# Patient Record
Sex: Female | Born: 1990 | Race: Asian | Hispanic: No | Marital: Single | Smoking: Never smoker
Health system: Southern US, Community
[De-identification: ages and names within clinical notes are randomized; demographics above are authoritative.]

## PROBLEM LIST (undated history)

## (undated) ENCOUNTER — Inpatient Hospital Stay (HOSPITAL_COMMUNITY): Payer: Self-pay

## (undated) DIAGNOSIS — O24419 Gestational diabetes mellitus in pregnancy, unspecified control: Secondary | ICD-10-CM

## (undated) HISTORY — PX: NO PAST SURGERIES: SHX2092

---
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
## 2010-04-16 ENCOUNTER — Encounter (INDEPENDENT_AMBULATORY_CARE_PROVIDER_SITE_OTHER): Payer: Medicaid Other | Admitting: Internal Medicine

## 2010-04-16 DIAGNOSIS — Z0000 Encounter for general adult medical examination without abnormal findings: Secondary | ICD-10-CM

## 2010-05-04 LAB — CBC
HCT: 30.7 % — ABNORMAL LOW (ref 36.0–46.0)
Hemoglobin: 10.9 g/dL — ABNORMAL LOW (ref 12.0–15.0)
RDW: 14.3 % (ref 11.5–15.5)

## 2010-05-04 LAB — RPR: RPR Ser Ql: NONREACTIVE

## 2010-06-09 NOTE — Discharge Summary (Signed)
Tammy Roberson, Tammy Roberson                ACCOUNT NO.:  [id number]   MEDICAL RECORD NO.:  [id number]          PATIENT TYPE:  INP   LOCATION:  9113                          FACILITY:  [facility]   PHYSICIAN:  Malachi Pro. Ambrose Mantle, M.D. DATE OF BIRTH:  03/03/1990   DATE OF ADMISSION:  06/25/2008  DATE OF DISCHARGE:  06/27/2008                               DISCHARGE SUMMARY   This is an 20 year old Asian single female para 0, gravida 1, EDC July 20, 2008, admitted in the second stage of labor.  Blood group and type  B+, negative antibody, nonreactive serology, rubella immune, hepatitis B  surface antigen negative, HIV negative, GC and chlamydia negative, first  trimester screen negative, AFP negative, 1-hour Glucola 92.  Vaginal  ultrasound on January 12, 2008, crown-rump length 4.65 cm, 11 weeks 3  days, [facility] July 20, 2008.  Prenatal care was uncomplicated.  The patient  came to our office on the day of admission with contractions and the  cervix was 8 cm dilated.  She was sent to Maternity Admission Unit, was  fully dilated, had spontaneous rupture of membranes of clear fluid in  the labor and delivery bed, and was unable to control her pushing.   PAST MEDICAL HISTORY:  No known allergies.  No operations.  No  illnesses.   FAMILY HISTORY:  Negative.   SOCIAL HISTORY:  Alcohol, tobacco and drugs none.   On admission, her vital signs were normal.  The heart and lungs were  normal.  The abdomen was soft, near term-size fundus.  Fetal heart tones  were normal.  The cervix was 10 cm.  Membranes were intact with the exam  that I did, the vertex at a +1 station.  After my exam, spontaneous  rupture of membranes produced clear fluid, vertex descended, and the  baby delivered.   IMPRESSION:  Intrauterine pregnancy at 36 weeks and 3 days, delivered  vertex LOA.  Operation, spontaneous delivery vertex, right labial and  vaginal lacerations.  Delivery was of a living 5-pound 2-ounce female  infant with  Apgars of 9 at 1 and 9 at 5 minutes.  Placenta was intact.  The uterus was normal.  Laceration repaired with 3-0 Vicryl under local  block.  Blood loss about 400 mL.  I instructed the patient and her  relatives at the time of delivery that in order to have circumcision  done.  They would need to pay for it, both to the office and to the  hospital.  They understood this.  On the morning of the first postpartum  day at 6:45 a.m., I called the nursery to see if the circumcision had  been paid.  It had not been paid.  On the second postpartum day at 6:45  a.m., I called the nursery to see if the circumcision had been paid.  It  had not been paid.  At the time of discharge, the patient stated that  she wanted the circumcision done, but there was no one there to pay for  the circumcision.  I told her that she should pay it and  we could  schedule it as outpatient that the hospital provides outpatient services  at least once a week.   LABORATORY DATA:  The patient did not have an antepartum hemoglobin.  She arrived ready for delivery, but her hemoglobin first postpartum day  was 10.9, hematocrit 30.7, white count 8400, platelet count 259,000.  RPR was nonreactive.   FINAL DIAGNOSES:  Intrauterine pregnancy at 36 weeks and 2 days,  delivered left occipitoanterior.   OPERATION:  Spontaneous delivery left occipitoanterior, repair of labial  and vaginal laceration.   FINAL CONDITION:  Improved.   INSTRUCTIONS:  Our regular discharge instruction booklet.  The patient  is advised to return to the office in 6 weeks for followup examination.  I will do the circumcision as an outpatient whenever they get that see  paid.   DISCHARGE MEDICATIONS:  Continue prenatal vitamins, take iron sulfate  325 mg twice a day, and Motrin 600 mg 30 tablets 1 every 6 hours as  needed for pain.      Malachi Pro. Ambrose Mantle, M.D.  Electronically Signed     TFH/MEDQ  D:  06/27/2008  T:  06/27/2008  Job:  [id number]

## 2012-01-26 NOTE — L&D Delivery Note (Signed)
Delivery Note At 9:56 AM a viable and healthy female was delivered via Vaginal, Spontaneous Delivery (Presentation: OA;ROT  ).  APGAR: 9,9 ; weight P.   Placenta status: delivered, intact.  Cord: 3VC with the following complications: nuchal .    Anesthesia: Epidural  Episiotomy: none Lacerations: labial abrasion - hemostatic Suture Repair: N/A Est. Blood Loss (mL): 400cc  Mom to postpartum.  Baby to Couplet care / Skin to Skin.  BOVARD,Babita Amaker 12/27/2012, 10:08 AM  Br/B+/Contra?Rachelle Hora

## 2012-05-30 LAB — OB RESULTS CONSOLE ANTIBODY SCREEN: Antibody Screen: NEGATIVE

## 2012-05-30 LAB — OB RESULTS CONSOLE ABO/RH

## 2012-05-30 LAB — OB RESULTS CONSOLE RUBELLA ANTIBODY, IGM: Rubella: IMMUNE

## 2012-05-30 LAB — OB RESULTS CONSOLE GC/CHLAMYDIA: Gonorrhea: NEGATIVE

## 2012-08-03 ENCOUNTER — Inpatient Hospital Stay (HOSPITAL_COMMUNITY)
Admission: AD | Admit: 2012-08-03 | Discharge: 2012-08-03 | Disposition: A | Discharge status: Home / Self Care | Payer: Medicaid Other | Source: Ambulatory Visit | Attending: Obstetrics and Gynecology | Admitting: Obstetrics and Gynecology

## 2012-08-03 ENCOUNTER — Encounter (HOSPITAL_COMMUNITY): Payer: Self-pay

## 2012-08-03 DIAGNOSIS — O99612 Diseases of the digestive system complicating pregnancy, second trimester: Secondary | ICD-10-CM

## 2012-08-03 DIAGNOSIS — O99891 Other specified diseases and conditions complicating pregnancy: Secondary | ICD-10-CM

## 2012-08-03 DIAGNOSIS — N949 Unspecified condition associated with female genital organs and menstrual cycle: Secondary | ICD-10-CM | POA: Insufficient documentation

## 2012-08-03 DIAGNOSIS — K5900 Constipation, unspecified: Secondary | ICD-10-CM

## 2012-08-03 DIAGNOSIS — R109 Unspecified abdominal pain: Secondary | ICD-10-CM | POA: Insufficient documentation

## 2012-08-03 LAB — CBC
Hemoglobin: 11.1 g/dL — ABNORMAL LOW (ref 12.0–15.0)
MCH: 30.2 pg (ref 26.0–34.0)
MCV: 85.6 fL (ref 78.0–100.0)
RBC: 3.67 MIL/uL — ABNORMAL LOW (ref 3.87–5.11)

## 2012-08-03 LAB — URINALYSIS, ROUTINE W REFLEX MICROSCOPIC
Bilirubin Urine: NEGATIVE
Hgb urine dipstick: NEGATIVE
Specific Gravity, Urine: 1.015 (ref 1.005–1.030)
Urobilinogen, UA: 0.2 mg/dL (ref 0.0–1.0)
pH: 7 (ref 5.0–8.0)

## 2012-08-03 LAB — COMPREHENSIVE METABOLIC PANEL
BUN: 5 mg/dL — ABNORMAL LOW (ref 6–23)
CO2: 24 mEq/L (ref 19–32)
Calcium: 8.9 mg/dL (ref 8.4–10.5)
Creatinine, Ser: 0.48 mg/dL — ABNORMAL LOW (ref 0.50–1.10)
GFR calc Af Amer: >90 mL/min (ref >90)
GFR calc non Af Amer: >90 mL/min (ref >90)
Glucose, Bld: 93 mg/dL (ref 70–99)

## 2012-08-03 LAB — AMYLASE: Amylase: 76 U/L (ref 0–105)

## 2012-08-03 MED ORDER — IBUPROFEN 600 MG PO TABS: 600.0000 mg | ORAL_TABLET | Freq: Four times a day (QID) | ORAL | Status: DC | PRN

## 2012-08-03 MED ORDER — OXYCODONE-ACETAMINOPHEN 5-325 MG PO TABS
1.0000 | ORAL_TABLET | Freq: Once | ORAL | Status: AC
  Administered 2012-08-03: 1 via ORAL
  Filled 2012-08-03: qty 1

## 2012-08-03 NOTE — MAU Note (Signed)
Intermittent abdominal pain since yesterday. Described as squeezing pain that starts at the of her abdomen and spreads to the bottom of her abdomen. Denies vaginal bleeding or leaking of fluid. Has had some vaginal discharge that is white and normal for her. Has episode of chills this morning but has not taken temperature.

## 2012-08-03 NOTE — MAU Provider Note (Signed)
Chief Complaint: Abdominal Pain and Chills   First Provider Initiated Contact with Patient 08/03/12 0544     SUBJECTIVE HPI: Tammy Roberson is a 22 y.o. G2P0101 at [redacted]w[redacted]d by [place] who presents with intermittent abd tightening from epigastric area wrapping around bital sides of abd to low abd. has had problems with constipation throughout the pregnancy. Last bowel movement yesterday. Took 2 doses of MiraLAX and past 24 hours, but has not had a bowel movement yet. Denies fever, chills, nausea, vomiting, diarrhea,  urinary complaints, vaginal bleeding, or leaking of fluid.  Normal vaginal discharge.  Past Medical History  Diagnosis Date  . Medical history non-contributory    OB History   Grav Para Term Preterm Abortions TAB SAB Ect Mult Living   2 1  1      1      # Outc Date GA Lbr Len/2nd Wgt Sex Del Anes PTL Lv   1 PRE 6/10 [redacted]w[redacted]d   M SVD   Yes   2 CUR              Past Surgical History  Procedure Laterality Date  . No past surgeries     History   Social History  . Marital Status: Single    Spouse Name: N/A    Number of Children: N/A  . Years of Education: N/A   Occupational History  . Not on file.   Social History Main Topics  . Smoking status: Never Smoker   . Smokeless tobacco: Not on file  . Alcohol Use: No  . Drug Use: No  . Sexually Active: Yes   Other Topics Concern  . Not on file   Social History Narrative  . No narrative on file   No current facility-administered medications on file prior to encounter.   No current outpatient prescriptions on file prior to encounter.   No Known Allergies  ROS: Pertinent items in HPI  OBJECTIVE Blood pressure 106/58, pulse 80, temperature 98 F (36.7 C), temperature source Oral, resp. rate 16, height 5' (1.524 m), weight 49.533 kg (109 lb 3.2 oz), last menstrual period 03/19/2012. GENERAL: Well-developed, well-nourished female in no acute distress.  HEENT: Normocephalic HEART: normal rate RESP: normal effort ABDOMEN:  Soft, mild bilateral groin tenderness. No epigastric tenderness. Gravid, size equals dates EXTREMITIES: Nontender, no edema NEURO: Alert and oriented SPECULUM EXAM: NEFG, physiologic discharge, no blood noted, cervix clean BIMANUAL: cervix closed and long; uterus size = dates, no adnexal tenderness or masses FHTs 152 by doppler.   LAB RESULTS Results for orders placed during the hospital encounter of 08/03/12 (from the past 24 hour(s))  URINALYSIS, ROUTINE W REFLEX MICROSCOPIC     Status: Abnormal   Collection Time    08/03/12  5:02 AM      Result Value Range   Color, Urine YELLOW  YELLOW   APPearance CLEAR  CLEAR   Specific Gravity, Urine 1.015  1.005 - 1.030   pH 7.0  5.0 - 8.0   Glucose, UA NEGATIVE  NEGATIVE mg/dL   Hgb urine dipstick NEGATIVE  NEGATIVE   Bilirubin Urine NEGATIVE  NEGATIVE   Ketones, ur 40 (*) NEGATIVE mg/dL   Protein, ur NEGATIVE  NEGATIVE mg/dL   Urobilinogen, UA 0.2  0.0 - 1.0 mg/dL   Nitrite NEGATIVE  NEGATIVE   Leukocytes, UA NEGATIVE  NEGATIVE  CBC     Status: Abnormal   Collection Time    08/03/12  5:40 AM      Result Value Range  WBC 7.0  4.0 - 10.5 K/uL   RBC 3.67 (*) 3.87 - 5.11 MIL/uL   Hemoglobin 11.1 (*) 12.0 - 15.0 g/dL   HCT 16.1 (*) 09.6 - 04.5 %   MCV 85.6  78.0 - 100.0 fL   MCH 30.2  26.0 - 34.0 pg   MCHC 35.4  30.0 - 36.0 g/dL   RDW 40.9  81.1 - 91.4 %   Platelets 204  150 - 400 K/uL  COMPREHENSIVE METABOLIC PANEL     Status: Abnormal   Collection Time    08/03/12  5:40 AM      Result Value Range   Sodium 134 (*) 135 - 145 mEq/L   Potassium 3.4 (*) 3.5 - 5.1 mEq/L   Chloride 100  96 - 112 mEq/L   CO2 24  19 - 32 mEq/L   Glucose, Bld 93  70 - 99 mg/dL   BUN 5 (*) 6 - 23 mg/dL   Creatinine, Ser 7.82 (*) 0.50 - 1.10 mg/dL   Calcium 8.9  8.4 - 95.6 mg/dL   Total Protein 6.1  6.0 - 8.3 g/dL   Albumin 3.1 (*) 3.5 - 5.2 g/dL   AST 13  0 - 37 U/L   ALT 7  0 - 35 U/L   Alkaline Phosphatase 52  39 - 117 U/L   Total Bilirubin 0.3   0.3 - 1.2 mg/dL   GFR calc non Af Amer >90  >90 mL/min   GFR calc Af Amer >90  >90 mL/min  LIPASE, BLOOD     Status: None   Collection Time    08/03/12  5:40 AM      Result Value Range   Lipase 27  11 - 59 U/L  AMYLASE     Status: None   Collection Time    08/03/12  5:40 AM      Result Value Range   Amylase 76  0 - 105 U/L    IMAGING No results found.  MAU COURSE  ASSESSMENT 1. Constipation in pregnancy in second trimester    PLAN Discharge home in stable condition. Increase fluids and fiber. Expect pain will improve after bowel movement.     Follow-up Information   Follow up with [facility]. (As scheduled)    Contact information:   [address] [place] [address] [phone]      Follow up with [facility] [place] [facility]. (As needed if symptoms worsen)    Contact information:   [address] [id number] [place] [address] [phone]       Medication List         ibuprofen 600 MG tablet  Commonly known as:  ADVIL,MOTRIN  Take 1 tablet (600 mg total) by mouth every 6 (six) hours as needed for pain.     polyethylene glycol packet  Commonly known as:  MIRALAX / GLYCOLAX  Take 17 g by mouth daily.     prenatal multivitamin Tabs  Take 1 tablet by mouth daily at 12 noon.       [place], CNM 08/03/2012  8:52 AM

## 2012-11-29 ENCOUNTER — Encounter: Payer: BC Managed Care – PPO | Attending: Obstetrics and Gynecology

## 2012-11-29 VITALS — Ht 59.0 in | Wt 130.3 lb

## 2012-11-29 DIAGNOSIS — Z713 Dietary counseling and surveillance: Secondary | ICD-10-CM | POA: Insufficient documentation

## 2012-11-29 DIAGNOSIS — O99810 Abnormal glucose complicating pregnancy: Secondary | ICD-10-CM | POA: Insufficient documentation

## 2012-12-04 NOTE — Progress Notes (Addendum)
  Patient was seen on 12/01/12 for Gestational Diabetes self-management class at the [facility].   The following learning objectives were presented.  However that patient slep through most of the class. She did awaken to partificate in glucometer activity.   States the definition of Gestational Diabetes  States why dietary management is important in controlling blood glucose  Describes the effects of carbohydrates on blood glucose levels  Demonstrates ability to create a balanced meal plan  Demonstrates carbohydrate counting   States when to check blood glucose levels  States the effect of stress and exercise on blood glucose levels  States the importance of limiting caffeine and abstaining from alcohol and smoking  Plan:  Aim for 2 Carb Choices per meal (30 grams) +/- 1 either way for breakfast Aim for 3 Carb Choices per meal (45 grams) +/- 1 either way from lunch and dinner Aim for 1-2 Carbs per snack Begin reading food labels for Total Carbohydrate and sugar grams of foods Consider  increasing your activity level by walking daily as tolerated Begin checking BG before breakfast and 1-2 hours after first bit of breakfast, lunch and dinner after  as directed by MD  Take medication  as directed by MD  Blood glucose monitor given: Accu Chek Nano BG Monitoring Kit Lot #[id number] Exp 11/30/4  Patient instructed to monitor glucose levels: FBS: 60 - <90 1 hour: <140 2 hour: <120  Patient received the following handouts:  Nutrition Diabetes and Pregnancy  Carbohydrate Counting List  Meal Planning worksheet  Patient will be seen for follow-up as needed.

## 2012-12-27 ENCOUNTER — Encounter (HOSPITAL_COMMUNITY): Payer: BC Managed Care – PPO | Admitting: Anesthesiology

## 2012-12-27 ENCOUNTER — Inpatient Hospital Stay (HOSPITAL_COMMUNITY)
Admission: AD | Admit: 2012-12-27 | Discharge: 2012-12-29 | DRG: 775 | Disposition: A | Discharge status: Home / Self Care | Payer: BC Managed Care – PPO | Source: Ambulatory Visit | Attending: Obstetrics and Gynecology | Admitting: Obstetrics and Gynecology

## 2012-12-27 ENCOUNTER — Encounter (HOSPITAL_COMMUNITY): Payer: Self-pay

## 2012-12-27 ENCOUNTER — Inpatient Hospital Stay (HOSPITAL_COMMUNITY): Payer: BC Managed Care – PPO | Admitting: Anesthesiology

## 2012-12-27 DIAGNOSIS — O99814 Abnormal glucose complicating childbirth: Principal | ICD-10-CM | POA: Diagnosis present

## 2012-12-27 HISTORY — DX: Gestational diabetes mellitus in pregnancy, unspecified control: O24.419

## 2012-12-27 LAB — CBC
MCH: 30.2 pg (ref 26.0–34.0)
MCV: 86.1 fL (ref 78.0–100.0)
Platelets: 259 10*3/uL (ref 150–400)
RBC: 4.4 MIL/uL (ref 3.87–5.11)
RDW: 13 % (ref 11.5–15.5)

## 2012-12-27 LAB — TYPE AND SCREEN
ABO/RH(D): B POS
Antibody Screen: NEGATIVE

## 2012-12-27 LAB — ABO/RH: ABO/RH(D): B POS

## 2012-12-27 MED ORDER — ONDANSETRON HCL 4 MG/2ML IJ SOLN: 4.0000 mg | INTRAMUSCULAR | Status: DC | PRN

## 2012-12-27 MED ORDER — OXYTOCIN 40 UNITS IN LACTATED RINGERS INFUSION - SIMPLE MED
62.5000 mL/h | INTRAVENOUS | Status: DC
  Filled 2012-12-27: qty 1000

## 2012-12-27 MED ORDER — LANOLIN HYDROUS EX OINT: TOPICAL_OINTMENT | CUTANEOUS | Status: DC | PRN

## 2012-12-27 MED ORDER — DIBUCAINE 1 % RE OINT: 1.0000 "application " | TOPICAL_OINTMENT | RECTAL | Status: DC | PRN

## 2012-12-27 MED ORDER — ZOLPIDEM TARTRATE 5 MG PO TABS: 5.0000 mg | ORAL_TABLET | Freq: Every evening | ORAL | Status: DC | PRN

## 2012-12-27 MED ORDER — WITCH HAZEL-GLYCERIN EX PADS: 1.0000 "application " | MEDICATED_PAD | CUTANEOUS | Status: DC | PRN

## 2012-12-27 MED ORDER — SIMETHICONE 80 MG PO CHEW: 80.0000 mg | CHEWABLE_TABLET | ORAL | Status: DC | PRN

## 2012-12-27 MED ORDER — LIDOCAINE-EPINEPHRINE (PF) 2 %-1:200000 IJ SOLN
INTRAMUSCULAR | Status: DC | PRN
  Administered 2012-12-27: 5 mL via EPIDURAL

## 2012-12-27 MED ORDER — ONDANSETRON HCL 4 MG/2ML IJ SOLN: 4.0000 mg | Freq: Four times a day (QID) | INTRAMUSCULAR | Status: DC | PRN

## 2012-12-27 MED ORDER — OXYTOCIN 40 UNITS IN LACTATED RINGERS INFUSION - SIMPLE MED
1.0000 m[IU]/min | INTRAVENOUS | Status: DC
  Administered 2012-12-27: 4 m[IU]/min via INTRAVENOUS
  Administered 2012-12-27: 2 m[IU]/min via INTRAVENOUS

## 2012-12-27 MED ORDER — LACTATED RINGERS IV SOLN: INTRAVENOUS | Status: DC

## 2012-12-27 MED ORDER — PHENYLEPHRINE 40 MCG/ML (10ML) SYRINGE FOR IV PUSH (FOR BLOOD PRESSURE SUPPORT)
80.0000 ug | PREFILLED_SYRINGE | INTRAVENOUS | Status: DC | PRN
  Administered 2012-12-27 (×2): 80 ug via INTRAVENOUS
  Filled 2012-12-27: qty 2

## 2012-12-27 MED ORDER — PHENYLEPHRINE 40 MCG/ML (10ML) SYRINGE FOR IV PUSH (FOR BLOOD PRESSURE SUPPORT)
80.0000 ug | PREFILLED_SYRINGE | INTRAVENOUS | Status: DC | PRN
  Filled 2012-12-27: qty 10
  Filled 2012-12-27: qty 2

## 2012-12-27 MED ORDER — DIPHENHYDRAMINE HCL 25 MG PO CAPS: 25.0000 mg | ORAL_CAPSULE | Freq: Four times a day (QID) | ORAL | Status: DC | PRN

## 2012-12-27 MED ORDER — LACTATED RINGERS IV SOLN
500.0000 mL | Freq: Once | INTRAVENOUS | Status: AC
  Administered 2012-12-27: 500 mL via INTRAVENOUS

## 2012-12-27 MED ORDER — BENZOCAINE-MENTHOL 20-0.5 % EX AERO: 1.0000 "application " | INHALATION_SPRAY | CUTANEOUS | Status: DC | PRN

## 2012-12-27 MED ORDER — LACTATED RINGERS IV SOLN
INTRAVENOUS | Status: DC
  Administered 2012-12-27 (×2): via INTRAVENOUS

## 2012-12-27 MED ORDER — LIDOCAINE HCL (PF) 1 % IJ SOLN
30.0000 mL | INTRAMUSCULAR | Status: DC | PRN
  Filled 2012-12-27 (×2): qty 30

## 2012-12-27 MED ORDER — EPHEDRINE 5 MG/ML INJ
10.0000 mg | INTRAVENOUS | Status: DC | PRN
  Filled 2012-12-27: qty 4
  Filled 2012-12-27: qty 2

## 2012-12-27 MED ORDER — PRENATAL MULTIVITAMIN CH
1.0000 | ORAL_TABLET | Freq: Every day | ORAL | Status: DC
  Administered 2012-12-28 – 2012-12-29 (×2): 1 via ORAL
  Filled 2012-12-27 (×2): qty 1

## 2012-12-27 MED ORDER — IBUPROFEN 600 MG PO TABS: 600.0000 mg | ORAL_TABLET | Freq: Four times a day (QID) | ORAL | Status: DC | PRN

## 2012-12-27 MED ORDER — EPHEDRINE 5 MG/ML INJ
10.0000 mg | INTRAVENOUS | Status: DC | PRN
  Administered 2012-12-27: 10 mg via INTRAVENOUS
  Filled 2012-12-27: qty 2

## 2012-12-27 MED ORDER — IBUPROFEN 600 MG PO TABS
600.0000 mg | ORAL_TABLET | Freq: Four times a day (QID) | ORAL | Status: DC
  Administered 2012-12-27 – 2012-12-29 (×8): 600 mg via ORAL
  Filled 2012-12-27 (×8): qty 1

## 2012-12-27 MED ORDER — ACETAMINOPHEN 325 MG PO TABS: 650.0000 mg | ORAL_TABLET | ORAL | Status: DC | PRN

## 2012-12-27 MED ORDER — CITRIC ACID-SODIUM CITRATE 334-500 MG/5ML PO SOLN: 30.0000 mL | ORAL | Status: DC | PRN

## 2012-12-27 MED ORDER — SENNOSIDES-DOCUSATE SODIUM 8.6-50 MG PO TABS
2.0000 | ORAL_TABLET | ORAL | Status: DC
  Administered 2012-12-28 (×2): 2 via ORAL
  Filled 2012-12-27 (×2): qty 2

## 2012-12-27 MED ORDER — FENTANYL 2.5 MCG/ML BUPIVACAINE 1/10 % EPIDURAL INFUSION (WH - ANES)
14.0000 mL/h | INTRAMUSCULAR | Status: DC | PRN
  Administered 2012-12-27: 14 mL/h via EPIDURAL
  Filled 2012-12-27: qty 125

## 2012-12-27 MED ORDER — OXYCODONE-ACETAMINOPHEN 5-325 MG PO TABS: 1.0000 | ORAL_TABLET | ORAL | Status: DC | PRN

## 2012-12-27 MED ORDER — DIPHENHYDRAMINE HCL 50 MG/ML IJ SOLN: 12.5000 mg | INTRAMUSCULAR | Status: DC | PRN

## 2012-12-27 MED ORDER — LACTATED RINGERS IV SOLN
500.0000 mL | INTRAVENOUS | Status: DC | PRN
  Administered 2012-12-27 (×2): 500 mL via INTRAVENOUS

## 2012-12-27 MED ORDER — OXYCODONE-ACETAMINOPHEN 5-325 MG PO TABS
1.0000 | ORAL_TABLET | ORAL | Status: DC | PRN
  Administered 2012-12-28 – 2012-12-29 (×6): 1 via ORAL
  Filled 2012-12-27 (×6): qty 1

## 2012-12-27 MED ORDER — OXYTOCIN BOLUS FROM INFUSION
500.0000 mL | INTRAVENOUS | Status: DC
  Administered 2012-12-27: 500 mL via INTRAVENOUS

## 2012-12-27 MED ORDER — FLEET ENEMA 7-19 GM/118ML RE ENEM: 1.0000 | ENEMA | RECTAL | Status: DC | PRN

## 2012-12-27 MED ORDER — ONDANSETRON HCL 4 MG PO TABS: 4.0000 mg | ORAL_TABLET | ORAL | Status: DC | PRN

## 2012-12-27 MED ORDER — TERBUTALINE SULFATE 1 MG/ML IJ SOLN: 0.2500 mg | Freq: Once | INTRAMUSCULAR | Status: DC | PRN

## 2012-12-27 NOTE — Anesthesia Postprocedure Evaluation (Signed)
  Anesthesia Post-op Note  Patient: Tammy Roberson  Procedure(s) Performed: * No procedures listed *  Patient Location: PACU and Mother/Baby  Anesthesia Type:Epidural  Level of Consciousness: awake, alert  and oriented  Airway and Oxygen Therapy: Patient Spontanous Breathing  Post-op Pain: mild  Post-op Assessment: Patient's Cardiovascular Status Stable, Respiratory Function Stable, No signs of Nausea or vomiting, Adequate PO intake, Pain level controlled, No headache, No backache, No residual numbness and No residual motor weakness  Post-op Vital Signs: stable  Complications: No apparent anesthesia complications

## 2012-12-27 NOTE — Progress Notes (Signed)
Patient ID: Tammy Roberson, female   DOB: 1990/12/02, 22 y.o.   MRN: [id number]  ROM for clear fluid, without difficulty or complication  After AROM - SVE 9/90/0-+1  Pt with severe variables, 140.  Good var ctx q  Pitocin to augment Close monitoring

## 2012-12-27 NOTE — H&P (Signed)
Tammy Roberson is a 22 y.o. female G2P0101at 38+ in labor, cervical change noted.  Relatively uncomplicated PNC, except some inconsistency early in gestation.  Also GDM - diet controlled.  +FM, no LOF, no VB, ctx - increasing in intensity and frequency.  Maternal Medical History:  Reason for admission: Contractions.   Contractions: Onset was 6-12 hours ago.   Frequency: regular.   Perceived severity is strong.    Fetal activity: Perceived fetal activity is normal.    Prenatal complications: no prenatal complications Prenatal Complications - Diabetes: gestational. Diabetes is managed by diet.      OB History   Grav Para Term Preterm Abortions TAB SAB Ect Mult Living   2 1  1      1     G1 36wk - spon labor 5#2 SVD female G2 present No abn pap, no STD  Past Medical History  Diagnosis Date  . Gestational diabetes   . Preterm labor    Past Surgical History  Procedure Laterality Date  . No past surgeries     Family History: family history is not on file. Social History:  reports that she has never smoked. She has never used smokeless tobacco. She reports that she does not drink alcohol or use illicit drugs.single Meds PNV All NKDA   Prenatal Transfer Tool  Maternal Diabetes: Yes:  Diabetes Type:  Diet controlled Genetic Screening: Normal Maternal Ultrasounds/Referrals: Normal Fetal Ultrasounds or other Referrals:  None Maternal Substance Abuse:  No Significant Maternal Medications:  None Significant Maternal Lab Results:  Lab values include: Group B Strep negative Other Comments:  inconsistent PNC - absent after anat screen to 29 week  Review of Systems  Constitutional: Negative.   HENT: Negative.   Eyes: Negative.   Respiratory: Negative.   Cardiovascular: Negative.   Gastrointestinal: Negative.   Genitourinary: Negative.   Musculoskeletal: Negative.   Skin: Negative.   Neurological: Negative.   Psychiatric/Behavioral: Negative.     Dilation: 5 Effacement (%):  80;90 Station: -1 Exam by:: e. poore, rn Blood pressure 95/45, pulse 90, temperature 97.6 F (36.4 C), temperature source Axillary, resp. rate 16, height 5' (1.524 m), weight 60.782 kg (134 lb), last menstrual period 03/19/2012, SpO2 100.00%. Maternal Exam:  Abdomen: Fundal height is appropriate for gestation.   Estimated fetal weight is 7#.   Fetal presentation: vertex  Introitus: Normal vulva. Normal vagina.  Pelvis: adequate for delivery.   Cervix: Cervix evaluated by digital exam.     Physical Exam  Constitutional: She is oriented to person, place, and time. She appears well-developed and well-nourished.  HENT:  Head: Normocephalic and atraumatic.  Cardiovascular: Normal rate and regular rhythm.   Respiratory: Effort normal and breath sounds normal. No respiratory distress. She has no wheezes.  GI: Soft. Bowel sounds are normal. She exhibits no distension. There is no tenderness.  Musculoskeletal: Normal range of motion.  Neurological: She is alert and oriented to person, place, and time.  Skin: Skin is warm and dry.  Psychiatric: She has a normal mood and affect. Her behavior is normal.    Prenatal labs: ABO, Rh: B/Positive/-- (05/06 0000) Antibody: Negative (05/06 0000) Rubella: Immune (05/06 0000) RPR: Nonreactive (05/06 0000)  HBsAg: Negative (05/06 0000)  HIV: Non-reactive (05/06 0000)  GBS: Negative (12/03 0000)   Hgb 13.5/ Pap WNL/ Varicella Immune/ had E coli UTI/ Chl neg/ GC neg/ CF neg/ First Tri and AFP WNL  Tdap 10/2 Flu 10/2  [place] dates confirmed at 7+ wk [facility] 01/07/13 [place] nl  anat, post plac  Assessment/Plan: 22yo G2P0101 at 38+ in labor Epidural for comfort Expect SVD GBBS neg Pitocin and AROM to augment prn.   BOVARD,Kebron Pulse 12/27/2012, 7:55 AM

## 2012-12-27 NOTE — Anesthesia Preprocedure Evaluation (Signed)

## 2012-12-27 NOTE — Anesthesia Procedure Notes (Signed)

## 2012-12-28 LAB — CBC
MCH: 30.3 pg (ref 26.0–34.0)
MCV: 87.5 fL (ref 78.0–100.0)
Platelets: 206 10*3/uL (ref 150–400)
RDW: 13.2 % (ref 11.5–15.5)
WBC: 13.3 10*3/uL — ABNORMAL HIGH (ref 4.0–10.5)

## 2012-12-28 NOTE — Lactation Note (Signed)
This note was copied from the chart of Tammy Roberson. Lactation Consultation Note  Patient Name: Tammy Roberson Date: 12/28/2012 Reason for consult: Follow-up assessment Mom reports baby has not been staying awake at the breast today. Mom has been using cradle hold, has some nipple tenderness and does not appear to be obtaining good depth with latching her baby. Stressed importance of good depth to increase milk transfer and prevent soreness. Assisted Mom with latching baby in cross cradle hold and how to obtain more depth with latch. Mom had some initial tenderness with latch that improved as the baby nursed. Advised Mom baby should be at the breast 8-12 times in 24 hour or more, advised to keep actively nursing for 15-20 minutes, both breasts if baby will. Cluster feeding discussed. Care for sore nipples reviewed. Advised to apply EBM. Advised to call for assist as needed with latch.   Maternal Data    Feeding Feeding Type: Breast Fed Length of feed: 10 min  LATCH Score/Interventions Latch: Grasps breast easily, tongue down, lips flanged, rhythmical sucking. (LC demonstrating breast compression) Intervention(s): Skin to skin;Teach feeding cues;Waking techniques Intervention(s): Adjust position;Assist with latch;Breast massage;Breast compression  Audible Swallowing: A few with stimulation Intervention(s): Skin to skin;Hand expression  Type of Nipple: Everted at rest and after stimulation (aerola edema)  Comfort (Breast/Nipple): Filling, red/small blisters or bruises, mild/mod discomfort  Problem noted: Mild/Moderate discomfort Interventions (Mild/moderate discomfort):  (EBM to sore nipples)  Hold (Positioning): Assistance needed to correctly position infant at breast and maintain latch. Intervention(s): Breastfeeding basics reviewed;Support Pillows;Position options;Skin to skin  LATCH Score: 7  Lactation Tools Discussed/Used     Consult Status Consult Status:  Follow-up Date: 12/29/12 Follow-up type: In-patient    Alfred Levins 12/28/2012, 4:38 PM

## 2012-12-28 NOTE — Progress Notes (Signed)
Post Partum Day 1 Subjective: no complaints, up ad lib, voiding, tolerating PO and nl lochia, pain controlled  Objective: Blood pressure 97/65, pulse 73, temperature 98.1 F (36.7 C), temperature source Oral, resp. rate 18, height 5' (1.524 m), weight 60.782 kg (134 lb), last menstrual period 03/19/2012, SpO2 98.00%, unknown if currently breastfeeding.  Physical Exam:  General: alert and no distress Lochia: appropriate Uterine Fundus: firm   Recent Labs  12/27/12 0520 12/28/12 0600  HGB 13.3 11.6*  HCT 37.9 33.5*    Assessment/Plan: Plan for discharge tomorrow, Breastfeeding and Lactation consult   LOS: 1 day   BOVARD,Travoris Bushey 12/28/2012, 7:41 AM

## 2012-12-29 MED ORDER — OXYCODONE-ACETAMINOPHEN 5-325 MG PO TABS: 1.0000 | ORAL_TABLET | Freq: Four times a day (QID) | ORAL | Status: DC | PRN

## 2012-12-29 MED ORDER — IBUPROFEN 800 MG PO TABS: 800.0000 mg | ORAL_TABLET | Freq: Three times a day (TID) | ORAL | Status: DC | PRN

## 2012-12-29 MED ORDER — PRENATAL MULTIVITAMIN CH: 1.0000 | ORAL_TABLET | Freq: Every day | ORAL | Status: DC

## 2012-12-29 NOTE — Progress Notes (Signed)
Post Partum Day 2 Subjective: no complaints, up ad lib, voiding, tolerating PO and nl lochia, pain controlled  Objective: Blood pressure 99/60, pulse 61, temperature 98.1 F (36.7 C), temperature source Oral, resp. rate 18, height 5' (1.524 m), weight 60.782 kg (134 lb), last menstrual period 03/19/2012, SpO2 98.00%, unknown if currently breastfeeding.  Physical Exam:  General: alert and no distress Lochia: appropriate Uterine Fundus: firm   Recent Labs  12/27/12 0520 12/28/12 0600  HGB 13.3 11.6*  HCT 37.9 33.5*    Assessment/Plan: Discharge home, Breastfeeding and Lactation consult.  D/c with motrin, percocet, and pnv.  F/u 6 weeks.     LOS: 2 days   BOVARD,Orlyn Odonoghue 12/29/2012, 7:43 AM

## 2012-12-29 NOTE — Lactation Note (Signed)
This note was copied from the chart of Tammy Jiah Bari. Lactation Consultation Note:   Patient Name: Tammy Roberson [id number] Date: 12/29/2012 Reason for consult: Follow-up assessment Infant breastfeeding.  Smacking noises heard, recommended mom pull baby closer for deeper latch.  Smacking noises were then eliminated.  Mom complained of nipple tenderness,no cracks or bleeding.  Comfort gels given and instructions reviewed.  Reviewed engorgement care and support services.    Maternal Data    Feeding Feeding Type: Breast Fed Length of feed: 30 min  LATCH Score/Interventions Intervention(s): Adjust position;Assist with latch  Audible Swallowing: Spontaneous and intermittent  Type of Nipple: Everted at rest and after stimulation     Problem noted: Mild/Moderate discomfort Interventions (Mild/moderate discomfort): Comfort gels  Hold (Positioning): Assistance needed to correctly position infant at breast and maintain latch. Intervention(s): Breastfeeding basics reviewed     Lactation Tools Discussed/Used Tools: Comfort gels   Consult Status Consult Status: Complete Date: 12/29/12 Follow-up type: In-patient    Tammy Roberson [facility] 12/29/2012, 11:51 AM

## 2012-12-29 NOTE — Discharge Summary (Signed)
Obstetric Discharge Summary Reason for Admission: onset of labor Prenatal Procedures: none Intrapartum Procedures: spontaneous vaginal delivery Postpartum Procedures: none Complications-Operative and Postpartum: vaginal laceration Hemoglobin  Date Value Range Status  12/28/2012 11.6* 12.0 - 15.0 g/dL Final     HCT  Date Value Range Status  12/28/2012 33.5* 36.0 - 46.0 % Final    Physical Exam:  General: alert and no distress Lochia: appropriate Uterine Fundus: firm  Discharge Diagnoses: Term Pregnancy-delivered  Discharge Information: Date: 12/29/2012 Activity: pelvic rest Diet: routine Medications: PNV, Ibuprofen and Percocet Condition: stable Instructions: refer to practice specific booklet Discharge to: home Follow-up Information   Follow up with BOVARD,Siddh Vandeventer, MD. Schedule an appointment as soon as possible for a visit in 6 weeks.   Specialty:  Obstetrics and Gynecology   Contact information:   [address] [place] [address] [phone]       Newborn Data: Live born female  Birth Weight: 6 lb 12.6 oz (3080 g) APGAR: 9, 9  Home with mother.  BOVARD,Yarethzy Croak 12/29/2012, 8:28 AM

## 2013-11-26 ENCOUNTER — Encounter (HOSPITAL_COMMUNITY): Payer: Self-pay

## 2018-07-11 ENCOUNTER — Encounter: Payer: Self-pay | Admitting: Internal Medicine

## 2018-07-11 ENCOUNTER — Ambulatory Visit (INDEPENDENT_AMBULATORY_CARE_PROVIDER_SITE_OTHER): Payer: BLUE CROSS/BLUE SHIELD | Admitting: Internal Medicine

## 2018-07-11 ENCOUNTER — Other Ambulatory Visit: Payer: Self-pay

## 2018-07-11 ENCOUNTER — Telehealth: Payer: Self-pay | Admitting: General Practice

## 2018-07-11 DIAGNOSIS — J0100 Acute maxillary sinusitis, unspecified: Secondary | ICD-10-CM | POA: Diagnosis not present

## 2018-07-11 NOTE — Patient Instructions (Addendum)
Zithromax Z-PAK take 2 tablets day 1 followed by 1 p.o. days 2 through 5.  Call if not better in 10 days to 2 weeks or sooner if worse.

## 2018-07-11 NOTE — Progress Notes (Signed)
   Subjective:    Patient ID: Tammy Roberson, female    DOB: April 13, 1990, 28 y.o.   MRN: [id number]  HPI 28 year old [place] Female presents to office for first time today. Mother is Amy Hefferan who is also a patient here.  Has 62 year old daughter who has a cough. Also has a son. 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 Has had on line classes.  No fever or chills. No travel history. No headache or myalgias. Has nasal congestion and post nasal discharge that is green.  Seen today using interactive audio and video telecommunications.  Using 2 identifiers, she is identified as Tammy Roberson date of birth 06-Aug-1990.  Patient acknowledges her mother is a patient here.  Patient is agreeable to visit in this format today.      Review of Systems see above     Objective:   Physical Exam Seen on video in no acute distress.  No tachypnea.       Assessment & Plan:  Acute maxillary sinusitis  Plan: Says she has used Zithromax successfully in the past.  Called in Zithromax Z-Pak take 2 tablets day 1 followed by 1 tablet days 2 through 5.  Call if symptoms persist beyond 10 days or sooner if worse.  Rest and drink plenty of fluids.

## 2018-07-11 NOTE — Telephone Encounter (Signed)
Tammy Roberson [phone]   [name] called to say she has had a cold for 3 weeks, with some congestion, cough some phlegm, no fever, no travel, suggested virtual visit.

## 2018-07-11 NOTE — Telephone Encounter (Signed)
Virtual visit 

## 2018-07-13 NOTE — Telephone Encounter (Signed)
Virtual visit scheduled.  

## 2019-05-10 ENCOUNTER — Other Ambulatory Visit: Payer: 59 | Admitting: Internal Medicine

## 2019-05-10 ENCOUNTER — Other Ambulatory Visit: Payer: Self-pay

## 2019-05-10 DIAGNOSIS — Z13220 Encounter for screening for lipoid disorders: Secondary | ICD-10-CM

## 2019-05-10 DIAGNOSIS — Z1329 Encounter for screening for other suspected endocrine disorder: Secondary | ICD-10-CM

## 2019-05-10 DIAGNOSIS — Z0000 Encounter for general adult medical examination without abnormal findings: Secondary | ICD-10-CM

## 2019-05-10 DIAGNOSIS — Z1321 Encounter for screening for nutritional disorder: Secondary | ICD-10-CM

## 2019-05-11 LAB — COMPLETE METABOLIC PANEL WITH GFR
AG Ratio: 1.9 (calc) (ref 1.0–2.5)
ALT: 7 U/L (ref 6–29)
AST: 12 U/L (ref 10–30)
Albumin: 4.6 g/dL (ref 3.6–5.1)
Alkaline phosphatase (APISO): 52 U/L (ref 31–125)
BUN: 11 mg/dL (ref 7–25)
CO2: 30 mmol/L (ref 20–32)
Calcium: 9.6 mg/dL (ref 8.6–10.2)
Chloride: 104 mmol/L (ref 98–110)
Creat: 0.81 mg/dL (ref 0.50–1.10)
GFR, Est African American: 114 mL/min/{1.73_m2} (ref >=60)
GFR, Est Non African American: 98 mL/min/{1.73_m2} (ref >=60)
Globulin: 2.4 g/dL (calc) (ref 1.9–3.7)
Glucose, Bld: 88 mg/dL (ref 65–99)
Potassium: 5 mmol/L (ref 3.5–5.3)
Sodium: 141 mmol/L (ref 135–146)
Total Bilirubin: 0.5 mg/dL (ref 0.2–1.2)
Total Protein: 7 g/dL (ref 6.1–8.1)

## 2019-05-11 LAB — LIPID PANEL
Cholesterol: 172 mg/dL (ref <200)
HDL: 57 mg/dL (ref >=50)
LDL Cholesterol (Calc): 99 mg/dL (calc)
Non-HDL Cholesterol (Calc): 115 mg/dL (calc) (ref <130)
Total CHOL/HDL Ratio: 3 (calc) (ref <5.0)
Triglycerides: 70 mg/dL (ref <150)

## 2019-05-11 LAB — CBC WITH DIFFERENTIAL/PLATELET
Absolute Monocytes: 308 cells/uL (ref 200–950)
Basophils Absolute: 22 cells/uL (ref 0–200)
Basophils Relative: 0.4 %
Eosinophils Absolute: 39 cells/uL (ref 15–500)
Eosinophils Relative: 0.7 %
HCT: 41.5 % (ref 35.0–45.0)
Hemoglobin: 13.9 g/dL (ref 11.7–15.5)
Lymphs Abs: 1705 cells/uL (ref 850–3900)
MCH: 30.1 pg (ref 27.0–33.0)
MCHC: 33.5 g/dL (ref 32.0–36.0)
MCV: 89.8 fL (ref 80.0–100.0)
MPV: 8.6 fL (ref 7.5–12.5)
Monocytes Relative: 5.6 %
Neutro Abs: 3427 cells/uL (ref 1500–7800)
Neutrophils Relative %: 62.3 %
Platelets: 370 10*3/uL (ref 140–400)
RBC: 4.62 10*6/uL (ref 3.80–5.10)
RDW: 12 % (ref 11.0–15.0)
Total Lymphocyte: 31 %
WBC: 5.5 10*3/uL (ref 3.8–10.8)

## 2019-05-11 LAB — TSH: TSH: 1.19 mIU/L

## 2019-05-11 LAB — VITAMIN D 25 HYDROXY (VIT D DEFICIENCY, FRACTURES): Vit D, 25-Hydroxy: 27 ng/mL — ABNORMAL LOW (ref 30–100)

## 2019-05-17 ENCOUNTER — Ambulatory Visit (INDEPENDENT_AMBULATORY_CARE_PROVIDER_SITE_OTHER): Payer: 59 | Admitting: Internal Medicine

## 2019-05-17 ENCOUNTER — Encounter: Payer: Self-pay | Admitting: Internal Medicine

## 2019-05-17 ENCOUNTER — Other Ambulatory Visit: Payer: Self-pay

## 2019-05-17 ENCOUNTER — Other Ambulatory Visit (HOSPITAL_COMMUNITY)
Admission: RE | Admit: 2019-05-17 | Discharge: 2019-05-17 | Disposition: A | Discharge status: Home / Self Care | Payer: 59 | Source: Ambulatory Visit | Attending: Internal Medicine | Admitting: Internal Medicine

## 2019-05-17 VITALS — BP 120/80 | Temp 98.0°F | Ht 59.0 in | Wt 114.0 lb

## 2019-05-17 DIAGNOSIS — L709 Acne, unspecified: Secondary | ICD-10-CM

## 2019-05-17 DIAGNOSIS — E559 Vitamin D deficiency, unspecified: Secondary | ICD-10-CM

## 2019-05-17 DIAGNOSIS — Z0000 Encounter for general adult medical examination without abnormal findings: Secondary | ICD-10-CM

## 2019-05-17 DIAGNOSIS — L700 Acne vulgaris: Secondary | ICD-10-CM

## 2019-05-17 DIAGNOSIS — Z124 Encounter for screening for malignant neoplasm of cervix: Secondary | ICD-10-CM | POA: Diagnosis not present

## 2019-05-17 DIAGNOSIS — F439 Reaction to severe stress, unspecified: Secondary | ICD-10-CM | POA: Insufficient documentation

## 2019-05-17 LAB — POCT URINALYSIS DIPSTICK
Appearance: NEGATIVE
Bilirubin, UA: NEGATIVE
Blood, UA: NEGATIVE
Glucose, UA: NEGATIVE
Ketones, UA: NEGATIVE
Leukocytes, UA: NEGATIVE
Nitrite, UA: NEGATIVE
Odor: NEGATIVE
Protein, UA: NEGATIVE
Spec Grav, UA: 1.01 (ref 1.010–1.025)
Urobilinogen, UA: 0.2 E.U./dL
pH, UA: 8 (ref 5.0–8.0)

## 2019-05-17 NOTE — Progress Notes (Signed)
   Subjective:    Patient ID: Tammy Roberson, female    DOB: December 02, 1990, 29 y.o.   MRN: [id number]  HPI 29 year old [place] Female presents for health maintenance exam.  Patient has acne vulgaris and would like referral to Dermatologist.  Her general health is excellent.  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 degree from [employer].  Social history: She has 2 children, a son and a daughter.  Does not consume alcohol.  Non-smoker.  History of gestational diabetes. Her mother is Amy Littrell also a patient here.  Family History: mother with history of hypothyroidism.    Review of Systems only complaint is acne.     Objective:   Physical Exam Blood pressure 120/80 temperature 98 degrees orally weight 114 pounds BMI 23.03  Skin warm and dry.  Has acne vulgaris lesions on face especially forehead.  Nodes none.  TMs are clear.  Neck is supple without thyromegaly or adenopathy.  Chest clear to auscultation.  Breast without masses.  Cardiac exam regular rate and rhythm normal S1 and S2.  Abdomen soft nondistended without hepatosplenomegaly masses or tenderness.  Pelvic exam: NFEG; Pap smear taken no masses on bimanual exam.  Rectovaginal confirms.  Extremities without deformity.  Neuro no focal deficits.  Judgment and thought and affect are normal.       Assessment & Plan:  Acne vulgaris-referral made to [facility]  Vitamin D deficiency-advised taking 4000 units vitamin D3 over-the-counter daily  Plan: Return in 1 year or as needed.  Restriction 1

## 2019-05-17 NOTE — Patient Instructions (Signed)
Take 4000 units daily for vitamin D3 deficiency.  Referral made to dermatologist regarding acne vulgaris. It was a pleasure to see you today.

## 2019-05-18 LAB — CYTOLOGY - PAP
Comment: NEGATIVE
Diagnosis: NEGATIVE
High risk HPV: NEGATIVE

## 2019-07-26 ENCOUNTER — Ambulatory Visit: Payer: Self-pay | Admitting: Physician Assistant

## 2019-08-23 ENCOUNTER — Ambulatory Visit (INDEPENDENT_AMBULATORY_CARE_PROVIDER_SITE_OTHER): Payer: 59 | Admitting: Physician Assistant

## 2019-08-23 ENCOUNTER — Encounter: Payer: Self-pay | Admitting: Physician Assistant

## 2019-08-23 ENCOUNTER — Other Ambulatory Visit: Payer: Self-pay

## 2019-08-23 DIAGNOSIS — L700 Acne vulgaris: Secondary | ICD-10-CM | POA: Diagnosis not present

## 2019-08-23 MED ORDER — CLINDAMYCIN PHOSPHATE 1 % EX GEL: Freq: Every day | CUTANEOUS | 2 refills | Status: DC

## 2019-08-23 MED ORDER — ADAPALENE-BENZOYL PEROXIDE 0.1-2.5 % EX GEL: 1.0000 "application " | Freq: Every day | CUTANEOUS | 2 refills | Status: DC

## 2019-08-23 NOTE — Progress Notes (Signed)
   New Patient Visit  Subjective  Tammy Roberson is a 29 y.o. female who presents for the following: New Patient (Initial Visit) (pt stated --acne --redness,inflammed, sore, worse with stress/mensturation--since teenager.). She has had it so long that she has gotten used to it. She feels her skin is on the oily side. She has tried Differin, proactive, cureology, neutrogena cleanser. Charcoal products seemed to aggravate her face. The acne is mainly on her face and a bit on her chest. She also had acne on her back but that has cleared. She is also having some discoloration.   Objective  Well appearing patient in no apparent distress; mood and affect are within normal limits.  Face examined. Relevant physical exam findings are noted in the Assessment and Plan.  Objective  Head - Anterior (Face): Erythematous papules and pustules with comedones   Assessment & Plan  Acne vulgaris Head - Anterior (Face)  clindamycin (CLINDAGEL) 1 % gel - Head - Anterior (Face)  Adapalene-Benzoyl Peroxide 0.1-2.5 % gel - Head - Anterior (Face)

## 2019-10-17 ENCOUNTER — Ambulatory Visit: Payer: 59 | Admitting: Physician Assistant

## 2020-04-23 ENCOUNTER — Encounter: Payer: Self-pay | Admitting: Internal Medicine

## 2020-04-23 ENCOUNTER — Ambulatory Visit (INDEPENDENT_AMBULATORY_CARE_PROVIDER_SITE_OTHER): Payer: No Typology Code available for payment source | Admitting: Internal Medicine

## 2020-04-23 ENCOUNTER — Other Ambulatory Visit: Payer: Self-pay

## 2020-04-23 VITALS — HR 106 | Temp 98.4°F | Ht 59.0 in | Wt 109.0 lb

## 2020-04-23 DIAGNOSIS — A084 Viral intestinal infection, unspecified: Secondary | ICD-10-CM

## 2020-04-23 NOTE — Patient Instructions (Signed)
Do not see mass at this point in time in the paraspinal area or on the spine at all.  Likely had viral gastroenteritis last week.  May return if symptoms recur or as needed.  It was a pleasure to see you today.

## 2020-04-23 NOTE — Progress Notes (Signed)
   Subjective:    Patient ID: Tammy Roberson, female    DOB: 23-Oct-1990, 30 y.o.   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 degree from [employer] presents with complaint of a "lump "on her back that she noticed last week.  It was in her lower back area.  It was uncomfortable.  There was no drainage from the area.  Had never noticed this area before.  No history of trauma to the area.  As of now, she cannot really feel it.  She also had an apparent viral illness with nausea and diarrhea last week as well.  The symptoms have resolved  Her general health is excellent.  Social history: She has 2 children, a son and a daughter.  Does not consume alcohol.  Non-smoker.  History of gestational diabetes.  She is engaged.  Working a lot of hours and work is difficult.  Family history: Mother with history of hypothyroidism and endocarditis.    Review of Systems see above     Objective:   Physical Exam  Her pulse is 106, temperature 98.4 degrees pulse oximetry 97% weight 109 pounds height 4 feet 11 inches BMI 22.02 skin is warm and dry.  I do not appreciate any masses in her paralumbar areas or along her spine at all.  She is seen in no acute distress.      Assessment & Plan:  Recent viral gastroenteritis  Not sure what type of lump she was feeling in her spine but do not see any type of mass such as a cyst.  No palpable mass.

## 2021-02-24 ENCOUNTER — Emergency Department (HOSPITAL_COMMUNITY)
Admission: EM | Admit: 2021-02-24 | Discharge: 2021-02-25 | Disposition: A | Discharge status: Home / Self Care | Payer: No Typology Code available for payment source | Attending: Emergency Medicine | Admitting: Emergency Medicine

## 2021-02-24 ENCOUNTER — Encounter (HOSPITAL_COMMUNITY): Payer: Self-pay

## 2021-02-24 ENCOUNTER — Telehealth: Payer: Self-pay

## 2021-02-24 ENCOUNTER — Emergency Department (HOSPITAL_COMMUNITY): Payer: No Typology Code available for payment source

## 2021-02-24 DIAGNOSIS — M25512 Pain in left shoulder: Secondary | ICD-10-CM | POA: Insufficient documentation

## 2021-02-24 DIAGNOSIS — R079 Chest pain, unspecified: Secondary | ICD-10-CM | POA: Diagnosis present

## 2021-02-24 DIAGNOSIS — M549 Dorsalgia, unspecified: Secondary | ICD-10-CM | POA: Insufficient documentation

## 2021-02-24 DIAGNOSIS — R0789 Other chest pain: Secondary | ICD-10-CM

## 2021-02-24 NOTE — Telephone Encounter (Signed)
Left message for patient to return call to office at [phone].  

## 2021-02-24 NOTE — Telephone Encounter (Signed)
Per patient she has dull pain in her lower spine x 1 week. Numbness is mostly in back. She did have an episode where she woke up and felt whole body was numb. Occasional chest pain-short sharp pains but then gone.

## 2021-02-24 NOTE — ED Triage Notes (Addendum)
Patient arrives from home with complaint of back pain located in between left and right shoulder, ongoing x 1 week. Pt also states she developed centralized chest pain today that radiates under the left breast.

## 2021-02-24 NOTE — Telephone Encounter (Signed)
Fiance Alex called to make an appt for the patient. She has been having som numbness in her back and headaches off and on. Also having a couple bouts of chest pain that comes and goes. Nothing constant. Please call him with appt [phone]

## 2021-02-24 NOTE — ED Provider Triage Note (Signed)
Emergency Medicine Provider Triage Evaluation Note  Tammy Roberson , a 31 y.o. female  was evaluated in triage.  Pt complains of right-sided upper back pain and chest pain.  Back pain started first and has been going on for about a week.  Chest pain has been intermittent but worsening over the last 24 hours.  No shortness of breath, fevers, chills, cough, congestion, nausea, vomiting, diarrhea.  Review of Systems  Positive:  Negative: See above   Physical Exam  BP 127/82 (BP Location: Left Arm)    Pulse 81    Temp 98 F (36.7 C) (Oral)    Resp 16    Ht 5' (1.524 m)    Wt 44.9 kg    SpO2 100%    BMI 19.33 kg/m  Gen:   Awake, no distress   Resp:  Normal effort  MSK:   Moves extremities without difficulty  Other:    Medical Decision Making  Medically screening exam initiated at 11:44 PM.  Appropriate orders placed.  Tammy Roberson was informed that the remainder of the evaluation will be completed by another provider, this initial triage assessment does not replace that evaluation, and the importance of remaining in the ED until their evaluation is complete.     Myna Bright [place], [place] 02/24/21 2348

## 2021-02-24 NOTE — ED Notes (Signed)
Save blue tube in main lab °

## 2021-02-24 NOTE — Telephone Encounter (Addendum)
Offered an appointment for 02/24/2021 at 3:00, patient stated she was at work and could not come in until Thursday or Friday afternoon.  Scheduled for Thursday

## 2021-02-25 LAB — CBC
HCT: 39.7 % (ref 36.0–46.0)
Hemoglobin: 13.4 g/dL (ref 12.0–15.0)
MCH: 30.3 pg (ref 26.0–34.0)
MCHC: 33.8 g/dL (ref 30.0–36.0)
MCV: 89.8 fL (ref 80.0–100.0)
Platelets: 358 10*3/uL (ref 150–400)
RBC: 4.42 MIL/uL (ref 3.87–5.11)
RDW: 12.3 % (ref 11.5–15.5)
WBC: 6.3 10*3/uL (ref 4.0–10.5)
nRBC: 0 % (ref 0.0–0.2)

## 2021-02-25 LAB — BASIC METABOLIC PANEL
Anion gap: 6 (ref 5–15)
BUN: 15 mg/dL (ref 6–20)
CO2: 29 mmol/L (ref 22–32)
Calcium: 9.2 mg/dL (ref 8.9–10.3)
Chloride: 103 mmol/L (ref 98–111)
Creatinine, Ser: 0.68 mg/dL (ref 0.44–1.00)
GFR, Estimated: >60 mL/min (ref >60)
Glucose, Bld: 114 mg/dL — ABNORMAL HIGH (ref 70–99)
Potassium: 4.1 mmol/L (ref 3.5–5.1)
Sodium: 138 mmol/L (ref 135–145)

## 2021-02-25 LAB — I-STAT BETA HCG BLOOD, ED (MC, WL, AP ONLY): I-stat hCG, quantitative: <5 m[IU]/mL (ref <5)

## 2021-02-25 LAB — TROPONIN I (HIGH SENSITIVITY): Troponin I (High Sensitivity): <2 ng/L (ref <18)

## 2021-02-25 NOTE — ED Provider Notes (Signed)
[facility] DEPT Provider Note   CSN: [id number] Arrival date & time: 02/24/21  2316     History  Chief Complaint  Patient presents with   Back Pain   Chest Pain    Tammy Roberson is a 31 y.o. female who presented who presents emergency department chief complaint of left shoulder blade and chest pain.  Patient states that she has been having pain she, numb, tingling pain in the left shoulder blade region.  Today she began having sharp pains in her chest lasting seconds at a time.  The last sharp pain she had was around 11 AM today at work.  She denies shortness of breath, diaphoresis, nausea, unilateral leg swelling, history of DVT or PE, hemoptysis, fevers chills or cough.  Nothing seems to make the pain worse or better.  She has been using topical anesthetic like Bengay which she thinks is making the area feel numb and tingly.  She denies a history of smoking, hypertension, hyperlipidemia, diabetes.  Her mother had an MI at the age of 65.   Back Pain Associated symptoms: chest pain   Chest Pain Associated symptoms: back pain       Home Medications Prior to Admission medications   Not on File      Allergies    Patient has no known allergies.    Review of Systems   Review of Systems  Cardiovascular:  Positive for chest pain.  Musculoskeletal:  Positive for back pain.   Physical Exam Updated Vital Signs BP 127/82 (BP Location: Left Arm)    Pulse 81    Temp 98 F (36.7 C) (Oral)    Resp 16    Ht 5' (1.524 m)    Wt 44.9 kg    LMP 02/15/2021    SpO2 100%    BMI 19.33 kg/m  Physical Exam Vitals and nursing note reviewed.  Constitutional:      General: She is not in acute distress.    Appearance: She is well-developed. She is not diaphoretic.  HENT:     Head: Normocephalic and atraumatic.     Right Ear: External ear normal.     Left Ear: External ear normal.     Nose: Nose normal.     Mouth/Throat:     Mouth: Mucous membranes are moist.   Eyes:     General: No scleral icterus.    Conjunctiva/sclera: Conjunctivae normal.  Cardiovascular:     Rate and Rhythm: Normal rate and regular rhythm.     Heart sounds: Normal heart sounds. No murmur heard.   No friction rub. No gallop.  Pulmonary:     Effort: Pulmonary effort is normal. No respiratory distress.     Breath sounds: Normal breath sounds.  Abdominal:     General: Bowel sounds are normal. There is no distension.     Palpations: Abdomen is soft. There is no mass.     Tenderness: There is no abdominal tenderness. There is no guarding.  Musculoskeletal:     Cervical back: Normal range of motion.     Comments: Tightness and spasm noted in the left shoulder blade and left pectoralis region as compared to the right with active trigger points.  Skin:    General: Skin is warm and dry.  Neurological:     Mental Status: She is alert and oriented to person, place, and time.  Psychiatric:        Behavior: Behavior normal.    ED Results / Procedures /  Treatments   Labs (all labs ordered are listed, but only abnormal results are displayed) Labs Reviewed  BASIC METABOLIC PANEL - Abnormal; Notable for the following components:      Result Value   Glucose, Bld 114 (*)    All other components within normal limits  CBC  I-STAT BETA HCG BLOOD, ED ([facility], [facility], [facility] ONLY)  TROPONIN I (HIGH SENSITIVITY)    EKG None  Radiology DG Chest 2 View  Result Date: 02/25/2021 CLINICAL DATA:  Chest pain and upper back pain. EXAM: CHEST - 2 VIEW COMPARISON:  None. FINDINGS: The heart size and mediastinal contours are within normal limits. Both lungs are clear. The visualized skeletal structures are unremarkable. IMPRESSION: No active cardiopulmonary disease. Electronically Signed   By: Ronney Asters M.D.   On: 02/25/2021 00:02    Procedures Procedures    Medications Ordered in ED Medications - No data to display  ED Course/ Medical Decision Making/ A&P Clinical Course as of 02/25/21 0106   Wed Feb 25, 2021  0056 EKG shows sinus rhythm at a rate of 92 with borderline prolonged QT, no other ischemic changes [AH]  0057 Chest x-ray shows no abnormalities.  I reviewed and interpreted images [AH]  0057 I-Stat beta hCG blood, ED [AH]  0057 CBC [AH]  0057 Troponin I (High Sensitivity) [AH]  123456 Basic metabolic panel(!) Labs are without acute finding [AH]    Clinical Course User Index [AH] Margarita Mail, PA-C           HEART Score: 1                Medical Decision Making Given the large differential diagnosis for Tammy Roberson, the decision making in this case is of high complexity.  After evaluating all of the data points in this case, the presentation of Tammy Roberson is NOT consistent with Acute Coronary Syndrome (ACS) and/or myocardial ischemia, pulmonary embolism, aortic dissection; Tammy Roberson, significant arrythmia, pneumothorax, cardiac tamponade, or other emergent cardiopulmonary condition.  Further, the presentation of Tammy Roberson is NOT consistent with pericarditis, myocarditis, cholecystitis, pancreatitis, mediastinitis, endocarditis, new valvular disease.  Additionally, the presentation of Tammy Roberson NOT consistent with flail chest, cardiac contusion, ARDS, or significant intra-thoracic or intra-abdominal bleeding.  Moreover, this presentation is NOT consistent with pneumonia, sepsis, or pyelonephritis.  The patient has a HEART Score: 1    Strict return and follow-up precautions have been given by me personally or by detailed written instruction given verbally by nursing staff using the teach back method to the patient/family/caregiver(s).  Data Reviewed/Counseling: I have reviewed the patient's vital signs, nursing notes, and other relevant tests/information. I had a detailed discussion regarding the historical points, exam findings, and any diagnostic results supporting the discharge diagnosis. I also discussed the need for outpatient follow-up and the need  to return to the ED if symptoms worsen or if there are any questions or concerns that arise at home.    Amount and/or Complexity of Data Reviewed Labs:  Decision-making details documented in ED Course. Radiology: independent interpretation performed. Decision-making details documented in ED Course. ECG/medicine tests: independent interpretation performed. Decision-making details documented in ED Course.  Risk Risk Details: Social determinants of health include: insured with close outpatient follow up.   Final Clinical Impression(s) / ED Diagnoses Final diagnoses:  None    Rx / DC Orders ED Discharge Orders     None         Margarita Mail, PA-C 02/25/21 0111    Gerlene Fee  M, MD 02/25/21 [phone]

## 2021-02-25 NOTE — Discharge Instructions (Signed)
You have been diagnosed by your caregiver as having chest wall pain. °SEEK IMMEDIATE MEDICAL ATTENTION IF: °You develop a fever.  °Your chest pains become severe or intolerable.  °You develop new, unexplained symptoms (problems).  °You develop shortness of breath, nausea, vomiting, sweating or feel light headed.  °You develop a new cough or you cough up blood. ° °

## 2021-02-26 ENCOUNTER — Ambulatory Visit (INDEPENDENT_AMBULATORY_CARE_PROVIDER_SITE_OTHER): Payer: No Typology Code available for payment source | Admitting: Internal Medicine

## 2021-02-26 ENCOUNTER — Other Ambulatory Visit: Payer: Self-pay

## 2021-02-26 ENCOUNTER — Encounter: Payer: Self-pay | Admitting: Internal Medicine

## 2021-02-26 VITALS — BP 108/76 | HR 98 | Temp 98.2°F | Wt 102.0 lb

## 2021-02-26 DIAGNOSIS — R0789 Other chest pain: Secondary | ICD-10-CM | POA: Diagnosis not present

## 2021-02-26 DIAGNOSIS — M898X1 Other specified disorders of bone, shoulder: Secondary | ICD-10-CM

## 2021-02-26 MED ORDER — CYCLOBENZAPRINE HCL 10 MG PO TABS: 5.0000 mg | ORAL_TABLET | Freq: Every day | ORAL | 0 refills | Status: DC

## 2021-02-26 MED ORDER — MELOXICAM 15 MG PO TABS: 15.0000 mg | ORAL_TABLET | Freq: Every day | ORAL | 0 refills | Status: DC

## 2021-02-26 NOTE — Progress Notes (Deleted)
° °  Subjective:    Patient ID: Tammy Roberson, female    DOB: [birth year], 31 y.o.   MRN: [id number]  HPI       Review of Systems     Objective:   Physical Exam        Assessment & Plan:

## 2021-03-02 ENCOUNTER — Ambulatory Visit (INDEPENDENT_AMBULATORY_CARE_PROVIDER_SITE_OTHER): Payer: No Typology Code available for payment source | Admitting: Internal Medicine

## 2021-03-02 ENCOUNTER — Encounter: Payer: Self-pay | Admitting: Internal Medicine

## 2021-03-02 ENCOUNTER — Other Ambulatory Visit: Payer: Self-pay

## 2021-03-02 VITALS — BP 122/74 | HR 80 | Temp 98.8°F

## 2021-03-02 DIAGNOSIS — R5383 Other fatigue: Secondary | ICD-10-CM

## 2021-03-02 DIAGNOSIS — M898X1 Other specified disorders of bone, shoulder: Secondary | ICD-10-CM

## 2021-03-02 DIAGNOSIS — R0789 Other chest pain: Secondary | ICD-10-CM | POA: Diagnosis not present

## 2021-03-02 NOTE — Progress Notes (Signed)
° °  Subjective:    Patient ID: Tammy Roberson, female    DOB: 1990-09-27, 31 y.o.   MRN: [id number]  HPI 31 year old [place] female seen in Emergency Department January 31 with left scapula and chest pain.  Has felt some numbness and tingling in the left shoulder blade region and sharp pains in her chest just lasting seconds at a time.  This occurred at work.  This was upsetting to her.  She denies shortness of breath.  No leg swelling or history of DVT.  No history of PE.  No hemoptysis, fever chills or cough.  She tried topical Bengay.  She thought it made the area feel numb and tingly.  She does not smoke.  Family history of MI in mother at age 30 which is concerning to her.  She had chest x-ray in the emergency department that showed no active cardiopulmonary disease and she had EKG in the emergency department that indicated she had a borderline prolonged QT interval.  By my reading it is 0.36.  She had sinus rhythm.  Serum pregnancy test was negative.  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  She has 2 children, a son and a daughter.  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  Days are long.  Does not consume alcohol.  She is a non-smoker.  She has history of gestational diabetes.  Her mother has had double valve due to endocarditis a little over a year ago.  It has been stressful over the past year but she has recovered and is doing well.  Mother has history of hypothyroidism.  MI was ruled out in the emergency department.  CBC was normal.  No anemia.  Nonfasting glucose was 114.  BUN and creatinine are normal.   Review of Systems see above still having pain in her chest and shoulder area.     Objective:   Physical Exam Vital signs reviewed and are stable.  Blood pressure is 122/74 pulse 80 temperature 98.8 degrees pulse oximetry 98% skin is warm and dry.  No cervical adenopathy.  No thyromegaly.  Her chest  is completely clear to auscultation without rales or wheezing.  Cardiac exam: Regular rate and rhythm without murmur gallop or ectopy.  She is tender in her parasternal areas bilaterally.  She does have pain along her left scapula to palpation.       Assessment & Plan:  Chest wall pain  Musculoskeletal pain left scapula  Plan: I think she has been under a great deal of stress and he is physically tired.  She is a working mother and has been working long hours.  I think this is musculoskeletal pain.  I do not think she has a cardiac condition.  I have prescribed Flexeril 10 mg to take 1/2 tablet at bedtime and meloxicam 15 mg to take daily with food number 30 tablets.  Physical therapy is a possibility if she is not improving.  She had a complete work-up in the emergency department including chest x-ray and EKG.  I do not see the reason to do additional testing at this time.  She was reassured.  I do not think she is depressed but I do think she is fatigued from working long hours and being a busy mother.

## 2021-03-21 NOTE — Patient Instructions (Signed)
She will try Flexeril one half of a 10 mg tablet at bedtime and meloxicam 15 mg daily.  If not improving, she can be referred to physical therapy.  She will try to get a bit more rest if possible.

## 2021-03-21 NOTE — Progress Notes (Deleted)
° °  Subjective:    Patient ID: Tammy Roberson, female    DOB: Jan 13, 1991, 31 y.o.   MRN: [id number]  HPI       Review of Systems     Objective:   Physical Exam        Assessment & Plan:

## 2021-03-22 NOTE — Progress Notes (Signed)
° °  Subjective:    Patient ID: Tammy Roberson, female    DOB: [birth year], 31 y.o.   MRN: [id number]  HPI 31 year old [place] Female seen in the emergency department January 31 with left scapula and chest pain.  Had some numbness and tingling in the left shoulder blade region and sharp pains in her chest just lasting seconds at a time.  This occurred at work.  This was upsetting.  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 Stands on her feet the entire time. She is a busy Mom.Has 2 children, a son and a daughter.   FHx: Mother has history of SBE requiring double valve replacement. Patient is a non-smoker and does not consume alcohol. Father in good health.  Hx of Vitamin D deficiency. Hx of gestational diabetes.  EKG from ED reviewed. No ectopy sinus rhythm. I believe the intervals are OK after checking them personally.  Review of Systems no nausea vomiting or diaphoresis  In ED, CBC and B-met were WNL. Troponin I was negative.     Objective:   Physical Exam BP 108/76, pulse 98 ,T 98.2 degrees ,pulse ox 98% ,Weight 102 pounds  Skin: warm and dry. Has pain with palpation left scapula but no redness or increased warmth. Parasternal chest wall tenderness c/w costchondritis      Assessment & Plan:   Musculoskeletal pain left scapula Chest wall pain  Plan:Meloxicam 15 mg daily with a meal. Flexeril 10 mg  one half tab at bedtime. Heat or ice to the scapula area as needed. Should improve within a few days.

## 2021-03-22 NOTE — Patient Instructions (Addendum)
I believe you have musculoskeletal chest wall pain and left scapular pain which is musculoskeletal in nature.  I do not think this is a heart issue.  MI was ruled out in the emergency department.  Please take meloxicam 15 mg daily with a meal and Flexeril 10 mg 1/2 tablet at bedtime as a muscle relaxant.  Apply heat or ice to the scapula area for 20 minutes at a time 2-3 times daily.  This pain should improve within a few days.

## 2021-03-28 ENCOUNTER — Other Ambulatory Visit: Payer: Self-pay | Admitting: Internal Medicine

## 2021-04-18 ENCOUNTER — Encounter: Payer: Self-pay | Admitting: Internal Medicine

## 2021-04-18 ENCOUNTER — Telehealth: Payer: Self-pay | Admitting: Internal Medicine

## 2021-04-18 MED ORDER — NORETHINDRONE 0.35 MG PO TABS: 1.0000 | ORAL_TABLET | Freq: Every day | ORAL | 11 refills | Status: DC

## 2021-04-18 NOTE — Telephone Encounter (Signed)
Generic Micronor send in as requested. Pap normal in 2021. ? MJB, MD ?

## 2021-05-27 ENCOUNTER — Encounter: Payer: Self-pay | Admitting: Internal Medicine

## 2021-05-28 NOTE — Telephone Encounter (Signed)
LVM to call office and schedule an appointment for her dad. ?

## 2021-06-03 NOTE — Telephone Encounter (Signed)
LVM to CB if her dad still needed to be seen. ?

## 2021-06-29 ENCOUNTER — Other Ambulatory Visit: Payer: No Typology Code available for payment source

## 2021-06-29 DIAGNOSIS — F439 Reaction to severe stress, unspecified: Secondary | ICD-10-CM

## 2021-06-29 DIAGNOSIS — R5383 Other fatigue: Secondary | ICD-10-CM

## 2021-06-29 DIAGNOSIS — Z0000 Encounter for general adult medical examination without abnormal findings: Secondary | ICD-10-CM

## 2021-06-29 DIAGNOSIS — Z136 Encounter for screening for cardiovascular disorders: Secondary | ICD-10-CM

## 2021-06-30 LAB — CBC WITH DIFFERENTIAL/PLATELET
Absolute Monocytes: 403 cells/uL (ref 200–950)
Basophils Absolute: 13 cells/uL (ref 0–200)
Basophils Relative: 0.2 %
Eosinophils Absolute: 33 cells/uL (ref 15–500)
Eosinophils Relative: 0.5 %
HCT: 42 % (ref 35.0–45.0)
Hemoglobin: 14.2 g/dL (ref 11.7–15.5)
Lymphs Abs: 1411 cells/uL (ref 850–3900)
MCH: 30.6 pg (ref 27.0–33.0)
MCHC: 33.8 g/dL (ref 32.0–36.0)
MCV: 90.5 fL (ref 80.0–100.0)
MPV: 8.9 fL (ref 7.5–12.5)
Monocytes Relative: 6.2 %
Neutro Abs: 4641 cells/uL (ref 1500–7800)
Neutrophils Relative %: 71.4 %
Platelets: 318 10*3/uL (ref 140–400)
RBC: 4.64 10*6/uL (ref 3.80–5.10)
RDW: 12.4 % (ref 11.0–15.0)
Total Lymphocyte: 21.7 %
WBC: 6.5 10*3/uL (ref 3.8–10.8)

## 2021-06-30 LAB — COMPLETE METABOLIC PANEL WITH GFR
AG Ratio: 1.8 (calc) (ref 1.0–2.5)
ALT: 10 U/L (ref 6–29)
AST: 14 U/L (ref 10–30)
Albumin: 4.8 g/dL (ref 3.6–5.1)
Alkaline phosphatase (APISO): 56 U/L (ref 31–125)
BUN: 14 mg/dL (ref 7–25)
CO2: 30 mmol/L (ref 20–32)
Calcium: 9.6 mg/dL (ref 8.6–10.2)
Chloride: 102 mmol/L (ref 98–110)
Creat: 0.72 mg/dL (ref 0.50–0.97)
Globulin: 2.7 g/dL (calc) (ref 1.9–3.7)
Glucose, Bld: 82 mg/dL (ref 65–139)
Potassium: 4.3 mmol/L (ref 3.5–5.3)
Sodium: 139 mmol/L (ref 135–146)
Total Bilirubin: 0.5 mg/dL (ref 0.2–1.2)
Total Protein: 7.5 g/dL (ref 6.1–8.1)
eGFR: 115 mL/min/{1.73_m2} (ref >=60)

## 2021-06-30 LAB — LIPID PANEL
Cholesterol: 173 mg/dL (ref <200)
HDL: 69 mg/dL (ref >=50)
LDL Cholesterol (Calc): 89 mg/dL (calc)
Non-HDL Cholesterol (Calc): 104 mg/dL (calc) (ref <130)
Total CHOL/HDL Ratio: 2.5 (calc) (ref <5.0)
Triglycerides: 63 mg/dL (ref <150)

## 2021-06-30 LAB — TSH: TSH: 1.56 mIU/L

## 2021-07-02 ENCOUNTER — Encounter: Payer: Self-pay | Admitting: Internal Medicine

## 2021-07-02 ENCOUNTER — Ambulatory Visit (INDEPENDENT_AMBULATORY_CARE_PROVIDER_SITE_OTHER): Payer: No Typology Code available for payment source | Admitting: Internal Medicine

## 2021-07-02 VITALS — BP 108/78 | HR 82 | Temp 97.8°F | Ht 61.0 in | Wt 101.5 lb

## 2021-07-02 DIAGNOSIS — B9620 Unspecified Escherichia coli [E. coli] as the cause of diseases classified elsewhere: Secondary | ICD-10-CM

## 2021-07-02 DIAGNOSIS — R82998 Other abnormal findings in urine: Secondary | ICD-10-CM

## 2021-07-02 DIAGNOSIS — N390 Urinary tract infection, site not specified: Secondary | ICD-10-CM | POA: Diagnosis not present

## 2021-07-02 DIAGNOSIS — Z0000 Encounter for general adult medical examination without abnormal findings: Secondary | ICD-10-CM

## 2021-07-02 DIAGNOSIS — R319 Hematuria, unspecified: Secondary | ICD-10-CM

## 2021-07-02 LAB — POCT URINALYSIS DIPSTICK
Bilirubin, UA: NEGATIVE
Glucose, UA: NEGATIVE
Ketones, UA: NEGATIVE
Nitrite, UA: NEGATIVE
Protein, UA: NEGATIVE
Spec Grav, UA: 1.015 (ref 1.010–1.025)
Urobilinogen, UA: 0.2 E.U./dL
pH, UA: 6.5 (ref 5.0–8.0)

## 2021-07-02 NOTE — Patient Instructions (Addendum)
It was a pleasure to see you today.  Your lab studies are all normal.  Immunizations are up-to-date.  Tetanus update will be due next year.  We have cultured your urine because we see a few white cells in the urine specimen.  Addendum: Treated with amoxicillin for 5 days for E. coli UTI.

## 2021-07-02 NOTE — Progress Notes (Signed)
Subjective:    Patient ID: Tammy Roberson, female    DOB: January 09, 1991, 31 y.o.   MRN: [id number]  HPI 31 year old  Female seen for annual health maintenance exam.  Her general health is excellent.  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  Social history: She is single.  She has 2 children, a son and a daughter.  She does not consume alcohol.  Non-smoker.  Her mother is Kerstie Sonner who is also a patient here.  Family history: Mother with history of hypothyroidism and double valve replacement due to strep viridans endocarditis.  Father healthy.  Currently has female friend in [place].  Not sexually active.  Review of Systems feels well with no new complaints     Objective:   Physical Exam Vitals reviewed.  Constitutional:      General: She is not in acute distress.    Appearance: Normal appearance.  HENT:     Head: Normocephalic and atraumatic.     Right Ear: Tympanic membrane normal.     Left Ear: Tympanic membrane normal.     Nose: Nose normal.     Mouth/Throat:     Pharynx: Oropharynx is clear.  Eyes:     General: No scleral icterus.       Right eye: No discharge.        Left eye: No discharge.     Extraocular Movements: Extraocular movements intact.     Pupils: Pupils are equal, round, and reactive to light.  Cardiovascular:     Rate and Rhythm: Normal rate and regular rhythm.     Heart sounds: Normal heart sounds. No murmur heard. Pulmonary:     Effort: Pulmonary effort is normal. No respiratory distress.     Breath sounds: Normal breath sounds. No wheezing or rales.  Abdominal:     General: Bowel sounds are normal. There is no distension.     Palpations: Abdomen is soft. There is no mass.     Tenderness: There is no abdominal tenderness. There is no right CVA tenderness, left CVA tenderness or guarding.  Genitourinary:    Comments: Pap smear was done in April 2021 and was not repeated today.  No masses on  bimanual exam. Musculoskeletal:        General: No deformity.     Cervical back: Neck supple. No rigidity.     Right lower leg: No edema.     Left lower leg: No edema.  Lymphadenopathy:     Cervical: No cervical adenopathy.  Skin:    General: Skin is warm and dry.     Findings: No rash.  Neurological:     General: No focal deficit present.     Mental Status: She is alert and oriented to person, place, and time.     Cranial Nerves: No cranial nerve deficit.     Sensory: No sensory deficit.     Motor: No weakness.     Coordination: Coordination normal.     Gait: Gait normal.  Psychiatric:        Mood and Affect: Mood normal.        Behavior: Behavior normal.        Thought Content: Thought content normal.        Judgment: Judgment normal.    Blood pressure 108/78 pulse 82 temperature 97.8 degrees pulse oximetry 98% weight 101 pounds 8 ounces height 5 feet 1 inches BMI 19.18  Assessment & Plan:   Normal health maintenance exam.  Labs reviewed and are within normal limits.  Abnormal urine dipstick.  Urine sent for culture and grew E. coli.  Treated with amoxicillin 250 mg 3 times a day for 5 days.  Plan: Return in 1 year or as needed.  Continue over-the-counter vitamin D supplementation.

## 2021-07-04 LAB — URINE CULTURE
MICRO NUMBER:: 13500953
SPECIMEN QUALITY:: ADEQUATE

## 2021-07-09 ENCOUNTER — Other Ambulatory Visit: Payer: Self-pay

## 2021-07-09 MED ORDER — FLUCONAZOLE 150 MG PO TABS: 150.0000 mg | ORAL_TABLET | Freq: Once | ORAL | 0 refills | Status: AC

## 2021-07-09 MED ORDER — AMOXICILLIN 250 MG PO CAPS: 250.0000 mg | ORAL_CAPSULE | Freq: Three times a day (TID) | ORAL | 0 refills | Status: DC

## 2022-01-15 ENCOUNTER — Encounter: Payer: Self-pay | Admitting: Internal Medicine

## 2022-01-15 MED ORDER — NORETHINDRONE 0.35 MG PO TABS: 1.0000 | ORAL_TABLET | Freq: Every day | ORAL | 11 refills | Status: DC

## 2022-01-15 NOTE — Telephone Encounter (Signed)
Refill Micronor x one year

## 2022-05-10 ENCOUNTER — Ambulatory Visit (INDEPENDENT_AMBULATORY_CARE_PROVIDER_SITE_OTHER): Payer: 59 | Admitting: Internal Medicine

## 2022-05-10 ENCOUNTER — Encounter: Payer: Self-pay | Admitting: Internal Medicine

## 2022-05-10 VITALS — BP 104/70 | HR 79 | Temp 98.4°F | Ht 61.0 in | Wt 106.1 lb

## 2022-05-10 DIAGNOSIS — R109 Unspecified abdominal pain: Secondary | ICD-10-CM | POA: Diagnosis not present

## 2022-05-10 DIAGNOSIS — R140 Abdominal distension (gaseous): Secondary | ICD-10-CM | POA: Diagnosis not present

## 2022-05-10 LAB — POCT URINALYSIS DIPSTICK
Bilirubin, UA: NEGATIVE
Blood, UA: POSITIVE
Glucose, UA: NEGATIVE
Leukocytes, UA: NEGATIVE
Nitrite, UA: NEGATIVE
Protein, UA: NEGATIVE
Spec Grav, UA: 1.02 (ref 1.010–1.025)
Urobilinogen, UA: 0.2 E.U./dL
pH, UA: 6 (ref 5.0–8.0)

## 2022-05-10 NOTE — Progress Notes (Signed)
Patient Care Team: [name], MD as PCP - General (Internal Medicine)  Visit Date: 05/10/22  Subjective:    Patient ID: Tammy Roberson , Female   DOB: 14-Aug-1990, 32 y.o.    MRN: [id number]   32 y.o. Female presents today for lower abdominal bloating that has been persistent for the past three months. Started taking birth control consistently in late 11/23. Stopped taking birth control one week ago due to fears this was contributing to abdominal bloating. Seen at [facility] for fever, odorous urine on 05/07/22. This was treated as a UTI with Macrobid. Symptoms have resolved. Last UTI was in 6/23. Bowel movements have been more infrequent. Has not been to her gynecologist in years. Has some vaginal discharge though this is normal for her.  Her general health is excellent.  She works as a Geographical information systems officer.  She has 2 children.  Does not consume alcohol and is a non-smoker.  Past Medical History:  Diagnosis Date   Gestational diabetes    Preterm labor    SVD (spontaneous vaginal delivery) 12/27/2012     Family History  Problem Relation Age of Onset   Hypothyroidism Mother          Review of Systems  Constitutional:  Negative for fever and malaise/fatigue.  HENT:  Negative for congestion.   Eyes:  Negative for blurred vision.  Respiratory:  Negative for cough and shortness of breath.   Cardiovascular:  Negative for chest pain, palpitations and leg swelling.  Gastrointestinal:  Positive for constipation. Negative for vomiting.       (+) Lower abdominal bloating  Musculoskeletal:  Negative for back pain.  Skin:  Negative for rash.  Neurological:  Negative for loss of consciousness and headaches.        Objective:   Vitals: BP 104/70   Pulse 79   Temp 98.4 F (36.9 C) (Tympanic)   Ht 5\' 1"  (1.549 m)   Wt 106 lb 1.9 oz (48.1 kg)   LMP 05/08/2022   SpO2 98%   BMI 20.05 kg/m    Physical Exam Vitals and nursing note reviewed.  Constitutional:       General: She is not in acute distress.    Appearance: Normal appearance. She is not toxic-appearing.  HENT:     Head: Normocephalic and atraumatic.  Pulmonary:     Effort: Pulmonary effort is normal.  Abdominal:     Palpations: Abdomen is soft. There is no hepatomegaly, splenomegaly or mass.     Tenderness: There is no abdominal tenderness.  Skin:    General: Skin is warm and dry.  Neurological:     Mental Status: She is alert and oriented to person, place, and time. Mental status is at baseline.  Psychiatric:        Mood and Affect: Mood normal.        Behavior: Behavior normal.        Thought Content: Thought content normal.        Judgment: Judgment normal.      Results:   Studies obtained and personally reviewed by me:   Labs:       Component Value Date/Time   NA 139 06/29/2021 0920   K 4.3 06/29/2021 0920   CL 102 06/29/2021 0920   CO2 30 06/29/2021 0920   GLUCOSE 82 06/29/2021 0920   BUN 14 06/29/2021 0920   CREATININE 0.72 06/29/2021 0920   CALCIUM 9.6 06/29/2021 0920   PROT 7.5 06/29/2021 0920  ALBUMIN 3.1 (L) 08/03/2012 0540   AST 14 06/29/2021 0920   ALT 10 06/29/2021 0920   ALKPHOS 52 08/03/2012 0540   BILITOT 0.5 06/29/2021 0920   GFRNONAA >60 02/24/2021 2354   GFRNONAA 98 05/10/2019 1013   GFRAA 114 05/10/2019 1013     Lab Results  Component Value Date   WBC 6.5 06/29/2021   HGB 14.2 06/29/2021   HCT 42.0 06/29/2021   MCV 90.5 06/29/2021   PLT 318 06/29/2021    Lab Results  Component Value Date   CHOL 173 06/29/2021   HDL 69 06/29/2021   LDLCALC 89 06/29/2021   TRIG 63 06/29/2021   CHOLHDL 2.5 06/29/2021    No results found for: "HGBA1C"   Lab Results  Component Value Date   TSH 1.56 06/29/2021      Assessment & Plan:   Lower abdominal bloating: symptoms are better today. She is currently on menses. Will continue to monitor.  Urinalysis showed blood, negative for LE and nitrite.   Blood in May be due to mild constipation  and she may benefit from [place] daily.  I,Alexander Ruley,acting as a Neurosurgeon for [name], MD.,have documented all relevant documentation on the behalf of [name], MD,as directed by  [name], MD while in the presence of [name], MD.   I, [name], MD, have reviewed all documentation for this visit. The documentation on 05/22/22 for the exam, diagnosis, procedures, and orders are all accurate and complete.

## 2022-05-22 NOTE — Patient Instructions (Signed)
Continue to monitor your symptoms.  If symptoms persist, I would try daily MiraLAX to see if symptoms resolve.  Urine dipstick does not show a UTI.  It was a pleasure to see you today.

## 2022-06-02 ENCOUNTER — Encounter: Payer: Self-pay | Admitting: Internal Medicine

## 2022-09-21 IMAGING — CR DG CHEST 2V
2 series · 2 of 2 positions shown · non-contrast
Comparison: None.

CLINICAL DATA: Chest pain and upper back pain.

EXAM:
CHEST - 2 VIEW

[w chest pa]
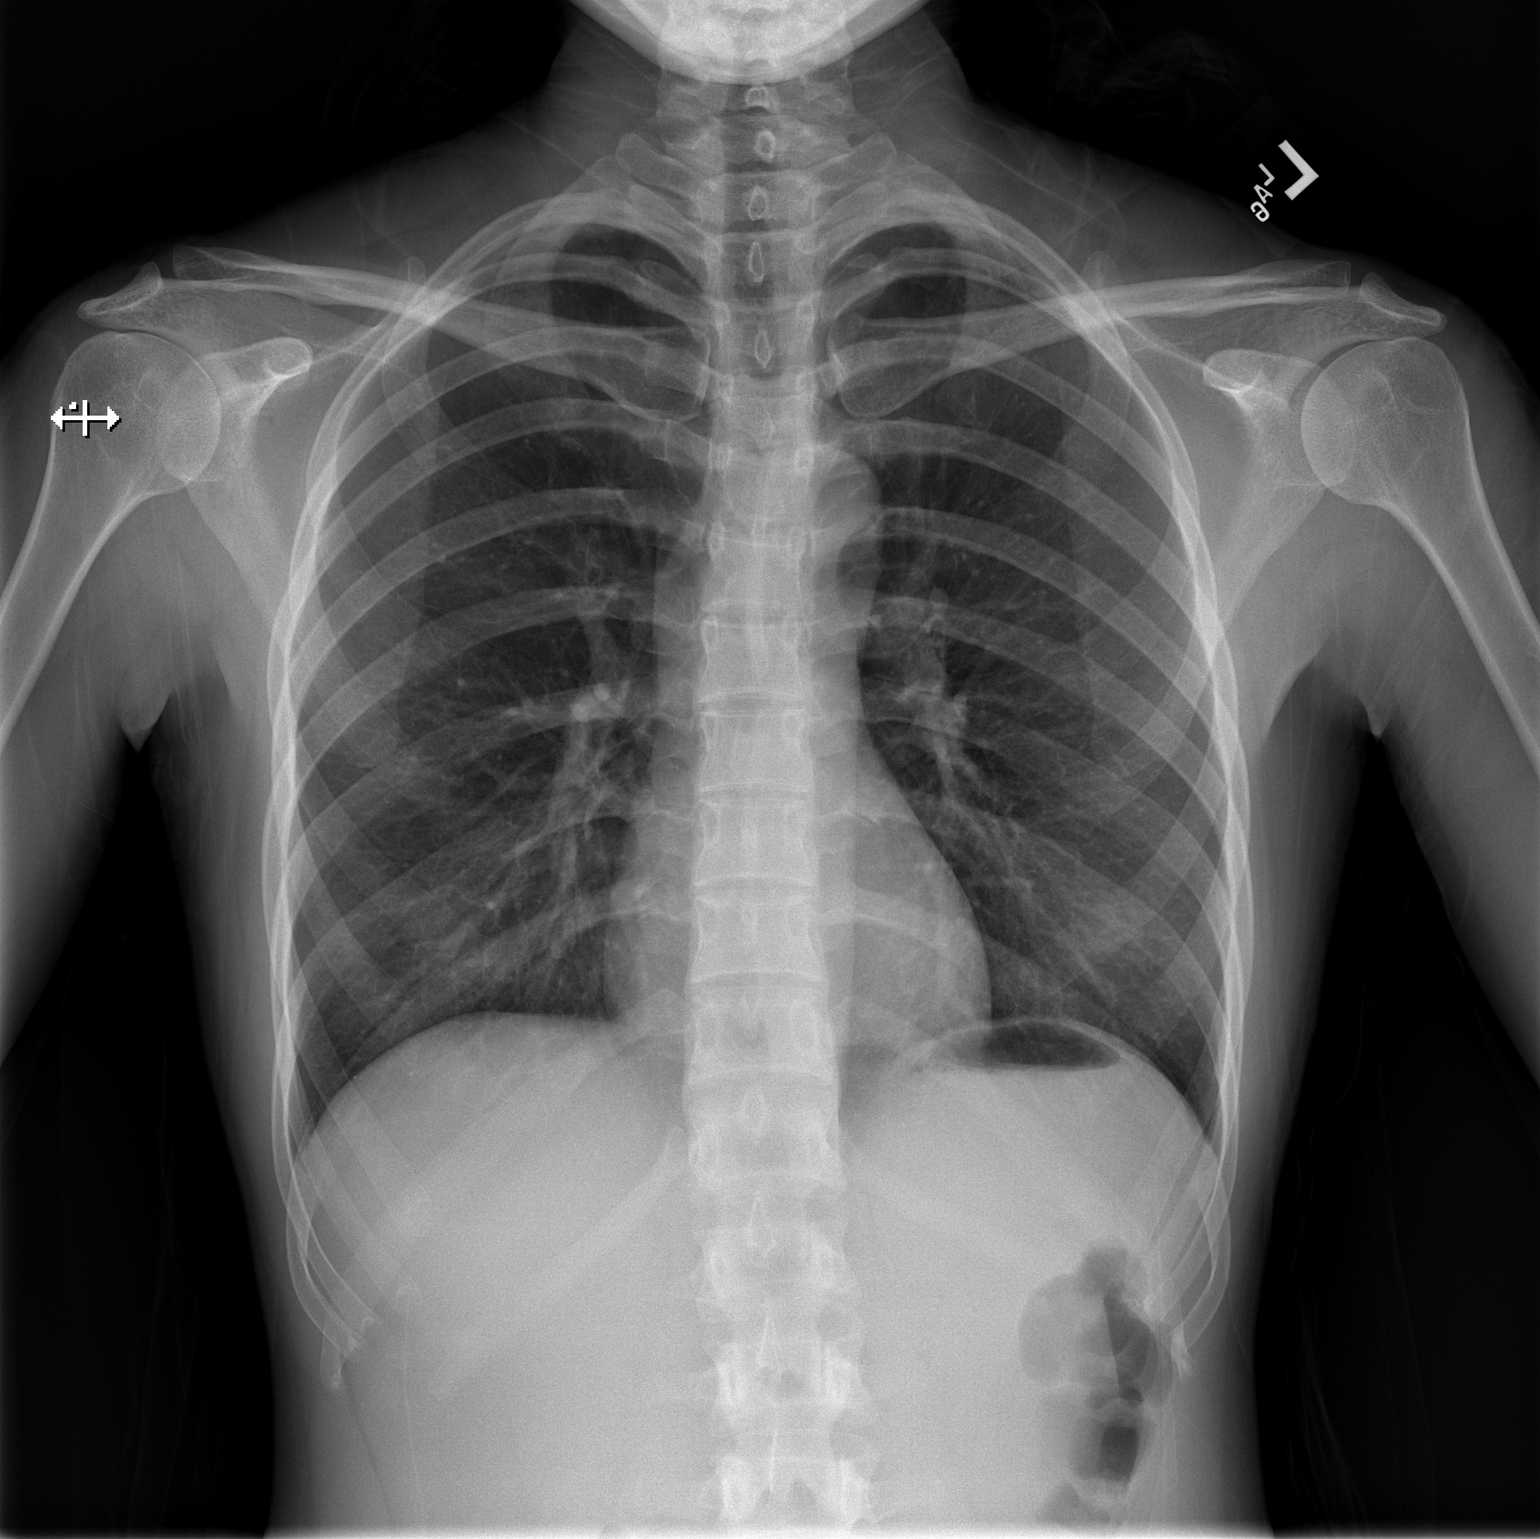

[w chest lat]
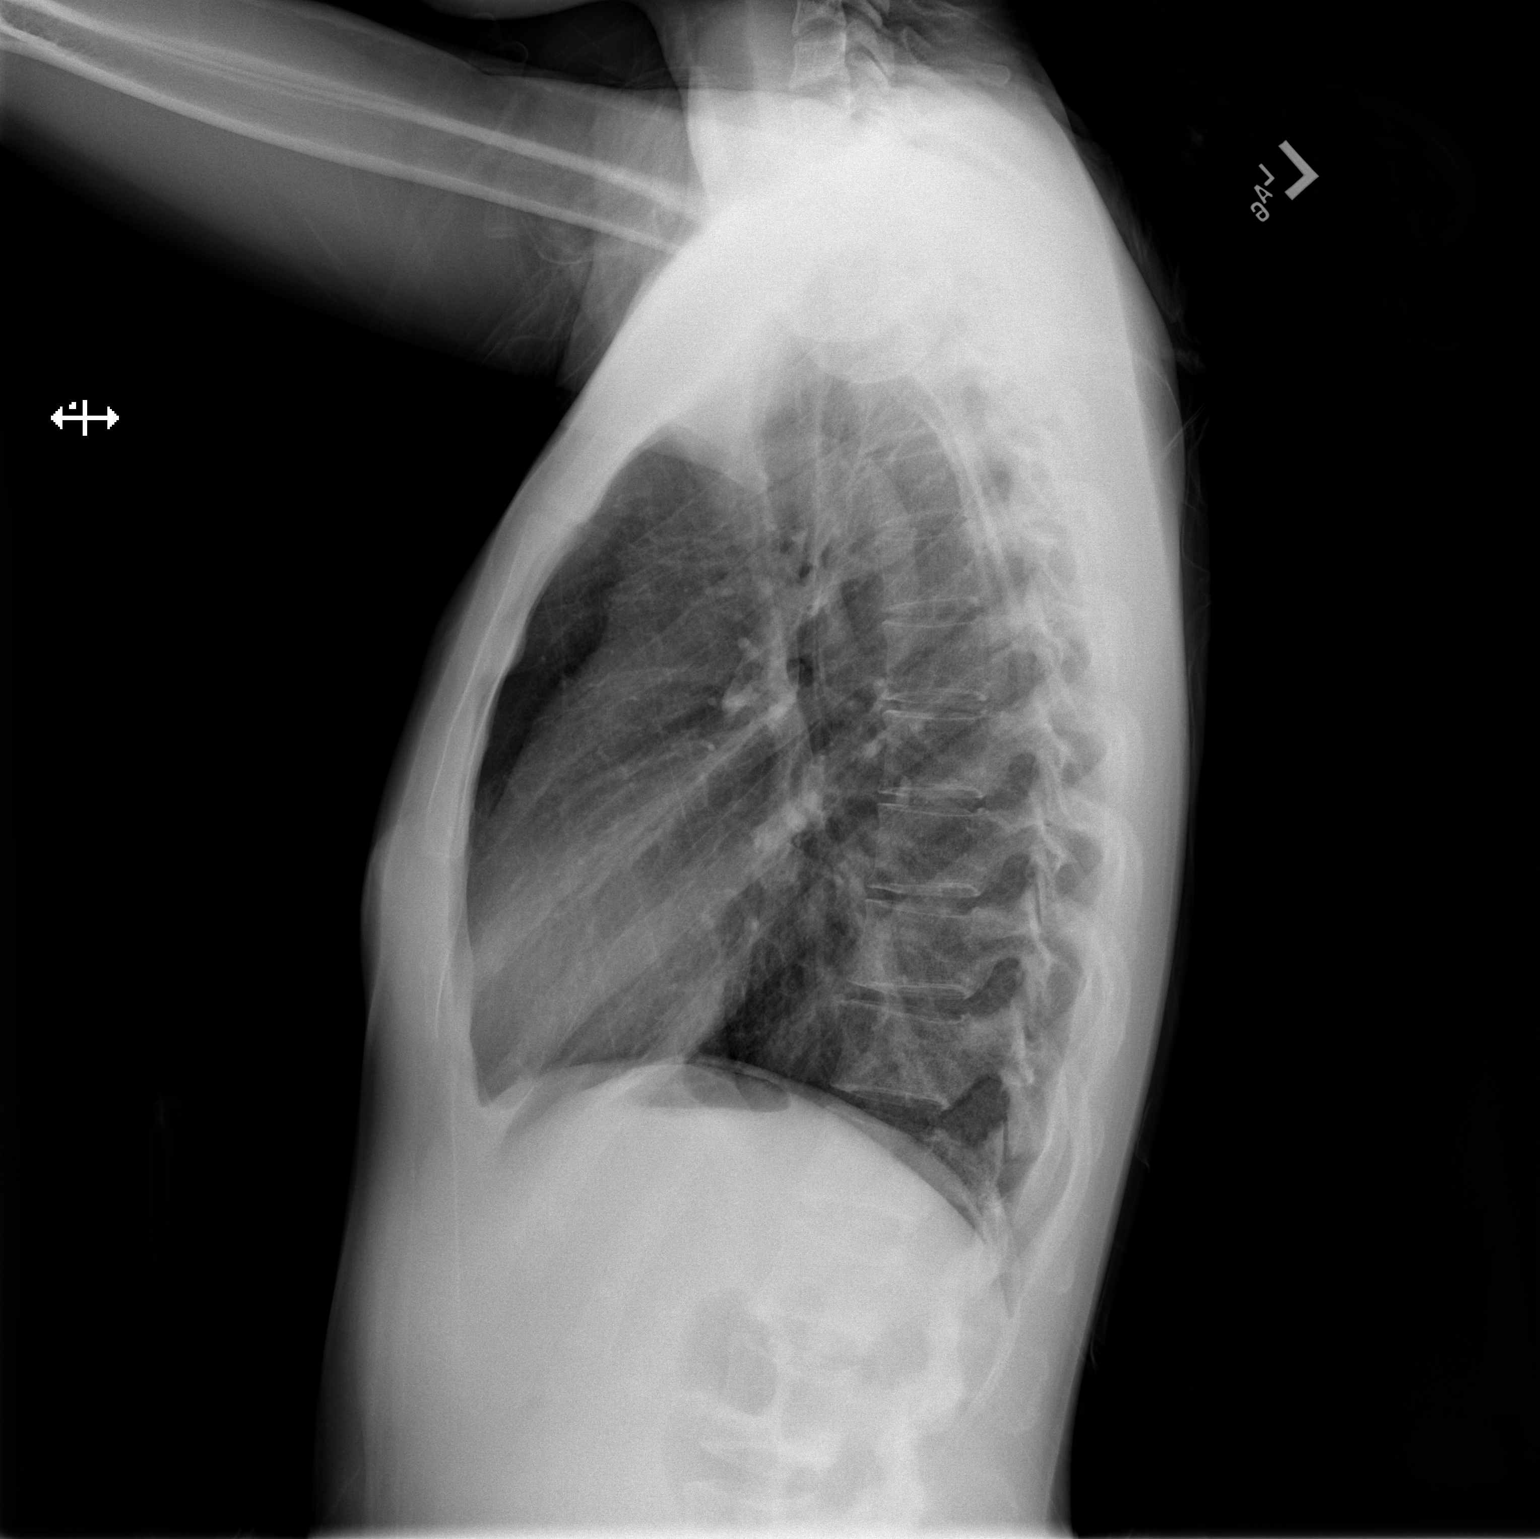

[2 of 2 positions shown; findings below may reference images not displayed]

FINDINGS: The heart size and mediastinal contours are within normal limits.
Both lungs are clear. The visualized skeletal structures are
unremarkable.
IMPRESSION: No active cardiopulmonary disease.

## 2022-10-26 NOTE — Progress Notes (Shared)
    Patient Care Team: [name], MD as PCP - General (Internal Medicine)  Visit Date: 10/26/22  Subjective:    Patient ID: Tammy Roberson , Female   DOB: February 06, 1990, 32 y.o.    MRN: [id number]   32 y.o. Female presents today for annual comprehensive physical exam. History of gestational diabetes.  Her general health is excellent.  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 degree from [employer].  Social history: She is single.  She has 2 children, a son and a daughter.  She does not consume alcohol.  Non-smoker.  Her mother is Briseidy Spark who is also a patient here.  Family history: Mother with history of hypothyroidism and double valve replacement due to strep viridans endocarditis.  Father healthy.    Vaccine counseling: due for tetanus vaccine.  Past Medical History:  Diagnosis Date   Gestational diabetes    Preterm labor    SVD (spontaneous vaginal delivery) 12/27/2012     Family History  Problem Relation Age of Onset   Hypothyroidism Mother     Social History   Social History Narrative   Not on file      ROS      Objective:   Vitals: There were no vitals taken for this visit.   Physical Exam    Results:   Studies obtained and personally reviewed by me:  Imaging, colonoscopy, mammogram, bone density scan, echocardiogram, heart cath, stress test, CT calcium score, etc. ***   Labs:       Component Value Date/Time   NA 139 06/29/2021 0920   K 4.3 06/29/2021 0920   CL 102 06/29/2021 0920   CO2 30 06/29/2021 0920   GLUCOSE 82 06/29/2021 0920   BUN 14 06/29/2021 0920   CREATININE 0.72 06/29/2021 0920   CALCIUM 9.6 06/29/2021 0920   PROT 7.5 06/29/2021 0920   ALBUMIN 3.1 (L) 08/03/2012 0540   AST 14 06/29/2021 0920   ALT 10 06/29/2021 0920   ALKPHOS 52 08/03/2012 0540   BILITOT 0.5 06/29/2021 0920   GFRNONAA >60 02/24/2021 2354   GFRNONAA 98 05/10/2019 1013   GFRAA 114 05/10/2019 1013     Lab Results   Component Value Date   WBC 6.5 06/29/2021   HGB 14.2 06/29/2021   HCT 42.0 06/29/2021   MCV 90.5 06/29/2021   PLT 318 06/29/2021    Lab Results  Component Value Date   CHOL 173 06/29/2021   HDL 69 06/29/2021   LDLCALC 89 06/29/2021   TRIG 63 06/29/2021   CHOLHDL 2.5 06/29/2021    No results found for: "HGBA1C"   Lab Results  Component Value Date   TSH 1.56 06/29/2021     No results found for: "PSA1", "PSA" *** delete for female pts  ***    Assessment & Plan:   ***    I,Alexander Ruley,acting as a scribe for [name], MD.,have documented all relevant documentation on the behalf of [name], MD,as directed by  [name], MD while in the presence of [name], MD.   ***

## 2022-11-01 ENCOUNTER — Other Ambulatory Visit: Payer: 59

## 2022-11-02 ENCOUNTER — Encounter: Payer: 59 | Admitting: Internal Medicine

## 2022-11-05 ENCOUNTER — Telehealth: Payer: Self-pay | Admitting: Internal Medicine

## 2022-11-05 ENCOUNTER — Encounter: Payer: Self-pay | Admitting: Internal Medicine

## 2022-11-05 NOTE — Telephone Encounter (Signed)
I left voice mail message for patient today. I understand she has recently cancelled her physical exam here. The expectation is that she will have a yearly exam here due to insurance expectations and [place] expectations with value based care. She had an exam with labs at [employer] where she works recently reported for insurance purposes. That is outside our Medical system. I have asked her to book an exam here  by calling Burna Mortimer to schedule. MJB, MD

## 2022-11-08 NOTE — Telephone Encounter (Signed)
Patient called and schedule CPE and Labs

## 2022-11-20 ENCOUNTER — Emergency Department (HOSPITAL_COMMUNITY)
Admission: EM | Admit: 2022-11-20 | Discharge: 2022-11-20 | Disposition: A | Discharge status: Home / Self Care | Payer: 59 | Attending: Emergency Medicine | Admitting: Emergency Medicine

## 2022-11-20 ENCOUNTER — Other Ambulatory Visit: Payer: Self-pay

## 2022-11-20 ENCOUNTER — Emergency Department (HOSPITAL_COMMUNITY): Payer: 59

## 2022-11-20 ENCOUNTER — Encounter (HOSPITAL_COMMUNITY): Payer: Self-pay

## 2022-11-20 DIAGNOSIS — R0789 Other chest pain: Secondary | ICD-10-CM | POA: Insufficient documentation

## 2022-11-20 DIAGNOSIS — R079 Chest pain, unspecified: Secondary | ICD-10-CM | POA: Diagnosis present

## 2022-11-20 LAB — BASIC METABOLIC PANEL
Anion gap: 7 (ref 5–15)
BUN: 13 mg/dL (ref 6–20)
CO2: 28 mmol/L (ref 22–32)
Calcium: 8.7 mg/dL — ABNORMAL LOW (ref 8.9–10.3)
Chloride: 106 mmol/L (ref 98–111)
Creatinine, Ser: 0.63 mg/dL (ref 0.44–1.00)
GFR, Estimated: >60 mL/min (ref >60)
Glucose, Bld: 79 mg/dL (ref 70–99)
Potassium: 3.8 mmol/L (ref 3.5–5.1)
Sodium: 141 mmol/L (ref 135–145)

## 2022-11-20 LAB — CBC
HCT: 39.5 % (ref 36.0–46.0)
Hemoglobin: 13.2 g/dL (ref 12.0–15.0)
MCH: 30.6 pg (ref 26.0–34.0)
MCHC: 33.4 g/dL (ref 30.0–36.0)
MCV: 91.6 fL (ref 80.0–100.0)
Platelets: 295 10*3/uL (ref 150–400)
RBC: 4.31 MIL/uL (ref 3.87–5.11)
RDW: 12.4 % (ref 11.5–15.5)
WBC: 5.8 10*3/uL (ref 4.0–10.5)
nRBC: 0 % (ref 0.0–0.2)

## 2022-11-20 LAB — TROPONIN I (HIGH SENSITIVITY)
Troponin I (High Sensitivity): <2 ng/L (ref <18)
Troponin I (High Sensitivity): <2 ng/L (ref <18)

## 2022-11-20 LAB — D-DIMER, QUANTITATIVE: D-Dimer, Quant: <0.27 ug{FEU}/mL (ref 0.00–0.50)

## 2022-11-20 LAB — HCG, SERUM, QUALITATIVE: Preg, Serum: NEGATIVE

## 2022-11-20 MED ORDER — MORPHINE SULFATE (PF) 4 MG/ML IV SOLN
4.0000 mg | Freq: Once | INTRAVENOUS | Status: AC
  Administered 2022-11-20: 4 mg via INTRAVENOUS
  Filled 2022-11-20: qty 1

## 2022-11-20 MED ORDER — LIDOCAINE 5 % EX PTCH
1.0000 | MEDICATED_PATCH | CUTANEOUS | Status: DC
  Administered 2022-11-20: 1 via TRANSDERMAL
  Filled 2022-11-20: qty 1

## 2022-11-20 MED ORDER — ASPIRIN 81 MG PO CHEW
324.0000 mg | CHEWABLE_TABLET | Freq: Once | ORAL | Status: DC
  Filled 2022-11-20: qty 4

## 2022-11-20 MED ORDER — KETOROLAC TROMETHAMINE 30 MG/ML IJ SOLN
30.0000 mg | Freq: Once | INTRAMUSCULAR | Status: AC
  Administered 2022-11-20: 30 mg via INTRAVENOUS
  Filled 2022-11-20: qty 1

## 2022-11-20 NOTE — ED Triage Notes (Signed)
Patient sent from urgent care. Reports left sided chest pain that radiates to the back x 3 days. States she felt a little SOB yesterday, but it has resolved. Denies cardiac hx.

## 2022-11-20 NOTE — Discharge Instructions (Addendum)
As discussed, your chest pain is not related to a heart attack or pulmonary embolism. It may be pleuritic in nature. Take NSAIDs every 6-8 hours as needed for pain.  Follow-up with your PCP in 5 days for reevaluation.  I have provided information for cardiology you can follow-up with if you want further evaluation.  Get help right away if: You have nausea or vomiting. You feel sweaty or light-headed. You have a cough with mucus from your lungs (sputum) or you cough up blood. You develop shortness of breath.

## 2022-11-20 NOTE — ED Provider Notes (Signed)
Tammy Roberson EMERGENCY DEPARTMENT AT [facility] Provider Note   CSN: [id number] Arrival date & time: 11/20/22  1256     History  Chief Complaint  Patient presents with   Chest Pain    Tammy Roberson is a 32 y.o. female with no significant past medical history presents the ED today for chest pain.  Patient reports she has been having left-sided chest pain for the past 3 days with an intermittent stabbing sensation.  Additionally, she reports pain to the upper back that spreads to her shoulders.  Last night she was walking to the bathroom and felt a little short of breath but it resolved on its own.  She has tried Meloxicam and Aspirin for her symptoms without relief.  Pain is better with laying down and worse with sitting up.  Patient was seen at urgent care prior to coming to the ED and they advised her to come here for further evaluation.  Denies previous cardiac history. No other complaints or concerns at this time.    Home Medications Prior to Admission medications   Medication Sig Start Date End Date Taking? Authorizing Provider  aspirin EC 325 MG tablet Take 325 mg by mouth as needed (for chest pain).   Yes [provider]  meloxicam (MOBIC) 15 MG tablet TAKE 1 TABLET BY MOUTH EVERY DAY WITH FOOD Patient taking differently: Take 15 mg by mouth daily as needed for pain (to be taken with food). 03/29/21  Yes Baxley, Luanna Cole, MD  cyclobenzaprine (FLEXERIL) 10 MG tablet Take 0.5 tablets (5 mg total) by mouth at bedtime. Patient not taking: Reported on 11/20/2022 02/26/21   [name], MD  norethindrone (ORTHO MICRONOR) 0.35 MG tablet Take 1 tablet (0.35 mg total) by mouth daily. Patient not taking: Reported on 11/20/2022 01/15/22   [name], MD      Allergies    Patient has no known allergies.    Review of Systems   Review of Systems  Cardiovascular:  Positive for chest pain.  All other systems reviewed and are negative.   Physical Exam Updated Vital  Signs BP 104/74 (BP Location: Right Arm)   Pulse 65   Temp 98.6 F (37 C) (Oral)   Resp 17   Ht 5\' 1"  (1.549 m)   Wt 48.1 kg   SpO2 100%   BMI 20.04 kg/m  Physical Exam Vitals and nursing note reviewed.  Constitutional:      General: She is not in acute distress.    Appearance: Normal appearance.  HENT:     Head: Normocephalic and atraumatic.     Mouth/Throat:     Mouth: Mucous membranes are moist.  Eyes:     Conjunctiva/sclera: Conjunctivae normal.     Pupils: Pupils are equal, round, and reactive to light.  Cardiovascular:     Rate and Rhythm: Normal rate and regular rhythm.     Pulses: Normal pulses.     Heart sounds: Normal heart sounds.  Pulmonary:     Effort: Pulmonary effort is normal.     Breath sounds: Normal breath sounds.  Abdominal:     Palpations: Abdomen is soft.     Tenderness: There is no abdominal tenderness.  Musculoskeletal:        General: Tenderness present.     Cervical back: Normal range of motion and neck supple. No rigidity or tenderness.     Comments: Midline tenderness to palpation of the upper back that spreads to the shoulders bilaterally. ROM  and strength of upper and lower extremities intact.  Skin:    General: Skin is warm and dry.     Findings: No rash.  Neurological:     General: No focal deficit present.     Mental Status: She is alert.     Sensory: No sensory deficit.     Motor: No weakness.  Psychiatric:        Mood and Affect: Mood normal.        Behavior: Behavior normal.    ED Results / Procedures / Treatments   Labs (all labs ordered are listed, but only abnormal results are displayed) Labs Reviewed  BASIC METABOLIC PANEL - Abnormal; Notable for the following components:      Result Value   Calcium 8.7 (*)    All other components within normal limits  CBC  HCG, SERUM, QUALITATIVE  D-DIMER, QUANTITATIVE  TROPONIN I (HIGH SENSITIVITY)  TROPONIN I (HIGH SENSITIVITY)    EKG EKG Interpretation Date/Time:  Saturday  November 20 2022 13:16:00 EDT Ventricular Rate:  74 PR Interval:  158 QRS Duration:  82 QT Interval:  392 QTC Calculation: 435 R Axis:   84  Text Interpretation: Sinus rhythm Confirmed by Kristine Royal [phone]) on 11/20/2022 2:09:19 PM  Radiology DG Chest Portable 1 View  Result Date: 11/20/2022 CLINICAL DATA:  LEFT-sided chest pain EXAM: PORTABLE CHEST 1 VIEW COMPARISON:  February 24, 2021 FINDINGS: The cardiomediastinal silhouette is normal in contour. No pleural effusion. No pneumothorax. No acute pleuroparenchymal abnormality. IMPRESSION: No acute cardiopulmonary abnormality. Electronically Signed   By: Meda Klinefelter M.D.   On: 11/20/2022 15:07    Procedures Procedures: not indicated.   Medications Ordered in ED Medications  lidocaine (LIDODERM) 5 % 1 patch (1 patch Transdermal Patch Applied 11/20/22 1419)  ketorolac (TORADOL) 30 MG/ML injection 30 mg (30 mg Intravenous Given 11/20/22 1419)  morphine (PF) 4 MG/ML injection 4 mg (4 mg Intravenous Given 11/20/22 1539)    ED Course/ Medical Decision Making/ A&P                                 Medical Decision Making Amount and/or Complexity of Data Reviewed Labs: ordered. Radiology: ordered.  Risk Prescription drug management.   This patient presents to the ED for concern of chest pain, this involves an extensive number of treatment options, and is a complaint that carries with it a high risk of complications and morbidity.   Differential diagnosis includes: ACS, pneumonia, pneumothorax, pulmonary embolism, costochondritis, pleurisy, muscle strain, contusion, etc.   Comorbidities  No significant PMH   Additional History  Additional history obtained from prior records.   Cardiac Monitoring / EKG  The patient was maintained on a cardiac monitor.  I personally viewed and interpreted the cardiac monitored which showed: sinus rhythm with a heart rate of 74 bpm.   Lab Tests  I ordered and personally  interpreted labs.  The pertinent results include:   Initial and repeat troponin less than 2 Negative D-dimer Negative pregnancy BMP and CBC are within normal limits - no acute electrolyte derangement, AKI, infection, or anemia.   Imaging Studies  I ordered imaging studies including CXR  I independently visualized and interpreted imaging which showed: No acute cardiopulmonary disease. I agree with the radiologist interpretation   Problem List / ED Course / Critical Interventions / Medication Management  Chest pain I ordered medications including: Ketorolac and Lidocaine for pain  Reevaluation  of the patient after these medicines showed that the patient stayed the same. I then ordered morphine which patient stated improved the pain some. I have reviewed the patients home medicines and have made adjustments as needed   Social Determinants of Health  Access to healthcare   Test / Admission - Considered  Discussed findings with patient.  All questions were answered. Patient is hemodynamically stable and safe for discharge home. Return precautions provided.       Final Clinical Impression(s) / ED Diagnoses Final diagnoses:  Atypical chest pain    Rx / DC Orders ED Discharge Orders     None         Maxwell Marion, PA-C 11/20/22 1652    Gloris Manchester, MD 11/21/22 2352

## 2022-11-22 ENCOUNTER — Other Ambulatory Visit: Payer: 59

## 2022-11-22 DIAGNOSIS — Z0000 Encounter for general adult medical examination without abnormal findings: Secondary | ICD-10-CM

## 2022-11-22 DIAGNOSIS — Z13220 Encounter for screening for lipoid disorders: Secondary | ICD-10-CM

## 2022-11-22 DIAGNOSIS — Z1329 Encounter for screening for other suspected endocrine disorder: Secondary | ICD-10-CM

## 2022-11-22 NOTE — Progress Notes (Addendum)
Patient Care Team: [name], MD as PCP - General (Internal Medicine)  Visit Date: 11/29/22  Subjective:    Patient ID: Tammy Roberson , Female   DOB: 16-Aug-1990, 32 y.o.    MRN: [id number]   32 y.o. Female presents today for annual comprehensive physical exam.  Her general health is excellent.  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  Denies irregular menses.  Seen in ED on 11/20/22 for chest pain. Given lidocaine patch, ketorolac, morphine. Chest X-ray showed no acute cardiopulmonary disease. Denies any recurrent chest pain. Has upcoming follow-up with cardiologist.  Labs reviewed today. Glucose normal. Kidney, liver functions normal. Electrolytes normal. Blood proteins normal. CBC normal. Lipid panel normal. TSH at 1.98.  Pap smear normal on 05/17/19.  Social history: She is single.  She has 2 children, a son and a daughter.  She does not consume alcohol.  Non-smoker.  Her mother is Dailynn Manera who is also a patient here.  Family history: Mother with history of hypothyroidism and double valve replacement due to strep viridans endocarditis.  Father healthy.  Past Medical History:  Diagnosis Date   Gestational diabetes    Preterm labor    SVD (spontaneous vaginal delivery) 12/27/2012     Family History  Problem Relation Age of Onset   Hypothyroidism Mother          Review of Systems  Constitutional:  Negative for chills, fever, malaise/fatigue and weight loss.  HENT:  Negative for hearing loss, sinus pain and sore throat.   Respiratory:  Negative for cough, hemoptysis and shortness of breath.   Cardiovascular:  Negative for chest pain, palpitations, leg swelling and PND.  Gastrointestinal:  Negative for abdominal pain, constipation, diarrhea, heartburn, nausea and vomiting.  Genitourinary:  Negative for dysuria, frequency and urgency.  Musculoskeletal:  Negative for back pain, myalgias and neck pain.   Skin:  Negative for itching and rash.  Neurological:  Negative for dizziness, tingling, seizures and headaches.  Endo/Heme/Allergies:  Negative for polydipsia.  Psychiatric/Behavioral:  Negative for depression. The patient is not nervous/anxious.         Objective:   Vitals: BP 110/80   Pulse 77   Ht 5\' 1"  (1.549 m)   Wt 110 lb (49.9 kg)   LMP 11/02/2022   SpO2 99%   BMI 20.78 kg/m    Physical Exam Vitals and nursing note reviewed.  Constitutional:      General: She is not in acute distress.    Appearance: Normal appearance. She is not ill-appearing or toxic-appearing.  HENT:     Head: Normocephalic and atraumatic.     Right Ear: Hearing, tympanic membrane, ear canal and external ear normal.     Left Ear: Hearing, tympanic membrane, ear canal and external ear normal.     Mouth/Throat:     Pharynx: Oropharynx is clear.  Eyes:     Extraocular Movements: Extraocular movements intact.     Pupils: Pupils are equal, round, and reactive to light.  Neck:     Thyroid: No thyroid mass, thyromegaly or thyroid tenderness.     Vascular: No carotid bruit.  Cardiovascular:     Rate and Rhythm: Normal rate and regular rhythm. No extrasystoles are present.    Heart sounds: Normal heart sounds. No murmur heard.    No friction rub. No gallop.  Pulmonary:     Effort: Pulmonary effort is normal.  Breath sounds: Normal breath sounds. No decreased breath sounds, wheezing, rhonchi or rales.  Chest:     Chest wall: No mass.  Abdominal:     Palpations: Abdomen is soft. There is no hepatomegaly, splenomegaly or mass.     Tenderness: There is no abdominal tenderness.     Hernia: No hernia is present.  Genitourinary:    Comments: NFEG. Pap smear taken. No masses on bimanual exam- rectovaginal confirms. Musculoskeletal:     Cervical back: Normal range of motion.     Right lower leg: No edema.     Left lower leg: No edema.  Lymphadenopathy:     Cervical: No cervical adenopathy.      Upper Body:     Right upper body: No supraclavicular adenopathy.     Left upper body: No supraclavicular adenopathy.  Skin:    General: Skin is warm and dry.  Neurological:     General: No focal deficit present.     Mental Status: She is alert and oriented to person, place, and time. Mental status is at baseline.     Sensory: Sensation is intact.     Motor: Motor function is intact. No weakness.     Deep Tendon Reflexes: Reflexes are normal and symmetric.  Psychiatric:        Attention and Perception: Attention normal.        Mood and Affect: Mood normal.        Speech: Speech normal.        Behavior: Behavior normal.        Thought Content: Thought content normal.        Cognition and Memory: Cognition normal.        Judgment: Judgment normal.       Results:   Studies obtained and personally reviewed by me:  Pap smear normal on 05/17/19.  Labs:       Component Value Date/Time   NA 141 11/22/2022 1009   K 4.6 11/22/2022 1009   CL 101 11/22/2022 1009   CO2 28 11/22/2022 1009   GLUCOSE 85 11/22/2022 1009   BUN 9 11/22/2022 1009   CREATININE 0.68 11/22/2022 1009   CALCIUM 9.7 11/22/2022 1009   PROT 7.1 11/22/2022 1009   ALBUMIN 3.1 (L) 08/03/2012 0540   AST 22 11/22/2022 1009   ALT 21 11/22/2022 1009   ALKPHOS 52 08/03/2012 0540   BILITOT 0.3 11/22/2022 1009   GFRNONAA >60 11/20/2022 1408   GFRNONAA 98 05/10/2019 1013   GFRAA 114 05/10/2019 1013     Lab Results  Component Value Date   WBC 4.0 11/22/2022   HGB 13.5 11/22/2022   HCT 41.3 11/22/2022   MCV 91.4 11/22/2022   PLT 293 11/22/2022    Lab Results  Component Value Date   CHOL 171 11/22/2022   HDL 68 11/22/2022   LDLCALC 83 11/22/2022   TRIG 104 11/22/2022   CHOLHDL 2.5 11/22/2022    No results found for: "HGBA1C"   Lab Results  Component Value Date   TSH 1.98 11/22/2022      Assessment & Plan:   Normal health maintenance exam. Labs reviewed and WNL.  Seen in ED for chest pain on  11/20/22 and denies any recurrence. She has a follow-up with Dr. Nanetta Batty, Cardiologist, on 01/12/23.  Refilled Flexeril for back pain as needed.  Pap smear normal on 05/17/19.  Urinalysis normal today.  Vaccine counseling: administered tetanus vaccine. UTD on flu booster. Declines Covid-19 vaccine.    I,Alexander  Ruley,acting as a Neurosurgeon for [name], MD.,have documented all relevant documentation on the behalf of [name], MD,as directed by  [name], MD while in the presence of [name], MD.  I, [name], MD, have reviewed all documentation for this visit. The documentation on 12/11/22 for the exam, diagnosis, procedures, and orders are all accurate and complete.

## 2022-11-23 LAB — COMPLETE METABOLIC PANEL WITH GFR
AG Ratio: 1.8 (calc) (ref 1.0–2.5)
ALT: 21 U/L (ref 6–29)
AST: 22 U/L (ref 10–30)
Albumin: 4.6 g/dL (ref 3.6–5.1)
Alkaline phosphatase (APISO): 55 U/L (ref 31–125)
BUN: 9 mg/dL (ref 7–25)
CO2: 28 mmol/L (ref 20–32)
Calcium: 9.7 mg/dL (ref 8.6–10.2)
Chloride: 101 mmol/L (ref 98–110)
Creat: 0.68 mg/dL (ref 0.50–0.97)
Globulin: 2.5 g/dL (ref 1.9–3.7)
Glucose, Bld: 85 mg/dL (ref 65–99)
Potassium: 4.6 mmol/L (ref 3.5–5.3)
Sodium: 141 mmol/L (ref 135–146)
Total Bilirubin: 0.3 mg/dL (ref 0.2–1.2)
Total Protein: 7.1 g/dL (ref 6.1–8.1)
eGFR: 119 mL/min/{1.73_m2} (ref >=60)

## 2022-11-23 LAB — CBC WITH DIFFERENTIAL/PLATELET
Absolute Lymphocytes: 1124 {cells}/uL (ref 850–3900)
Absolute Monocytes: 340 {cells}/uL (ref 200–950)
Basophils Absolute: 12 {cells}/uL (ref 0–200)
Basophils Relative: 0.3 %
Eosinophils Absolute: 20 {cells}/uL (ref 15–500)
Eosinophils Relative: 0.5 %
HCT: 41.3 % (ref 35.0–45.0)
Hemoglobin: 13.5 g/dL (ref 11.7–15.5)
MCH: 29.9 pg (ref 27.0–33.0)
MCHC: 32.7 g/dL (ref 32.0–36.0)
MCV: 91.4 fL (ref 80.0–100.0)
MPV: 9 fL (ref 7.5–12.5)
Monocytes Relative: 8.5 %
Neutro Abs: 2504 {cells}/uL (ref 1500–7800)
Neutrophils Relative %: 62.6 %
Platelets: 293 10*3/uL (ref 140–400)
RBC: 4.52 10*6/uL (ref 3.80–5.10)
RDW: 12 % (ref 11.0–15.0)
Total Lymphocyte: 28.1 %
WBC: 4 10*3/uL (ref 3.8–10.8)

## 2022-11-23 LAB — LIPID PANEL
Cholesterol: 171 mg/dL (ref <200)
HDL: 68 mg/dL (ref >=50)
LDL Cholesterol (Calc): 83 mg/dL
Non-HDL Cholesterol (Calc): 103 mg/dL (ref <130)
Total CHOL/HDL Ratio: 2.5 (calc) (ref <5.0)
Triglycerides: 104 mg/dL (ref <150)

## 2022-11-23 LAB — TSH: TSH: 1.98 m[IU]/L

## 2022-11-29 ENCOUNTER — Ambulatory Visit: Payer: 59 | Admitting: Internal Medicine

## 2022-11-29 ENCOUNTER — Encounter: Payer: Self-pay | Admitting: Internal Medicine

## 2022-11-29 ENCOUNTER — Other Ambulatory Visit (HOSPITAL_COMMUNITY)
Admission: RE | Admit: 2022-11-29 | Discharge: 2022-11-29 | Disposition: A | Discharge status: Home / Self Care | Payer: Managed Care, Other (non HMO) | Source: Ambulatory Visit | Attending: Internal Medicine | Admitting: Internal Medicine

## 2022-11-29 VITALS — BP 110/80 | HR 77 | Ht 61.0 in | Wt 110.0 lb

## 2022-11-29 DIAGNOSIS — Z124 Encounter for screening for malignant neoplasm of cervix: Secondary | ICD-10-CM

## 2022-11-29 DIAGNOSIS — Z0000 Encounter for general adult medical examination without abnormal findings: Secondary | ICD-10-CM | POA: Diagnosis not present

## 2022-11-29 DIAGNOSIS — M7918 Myalgia, other site: Secondary | ICD-10-CM | POA: Diagnosis not present

## 2022-11-29 DIAGNOSIS — Z23 Encounter for immunization: Secondary | ICD-10-CM | POA: Diagnosis not present

## 2022-11-29 LAB — POCT URINALYSIS DIP (CLINITEK)
Bilirubin, UA: NEGATIVE
Blood, UA: NEGATIVE
Glucose, UA: NEGATIVE mg/dL
Ketones, POC UA: NEGATIVE mg/dL
Leukocytes, UA: NEGATIVE
Nitrite, UA: NEGATIVE
POC PROTEIN,UA: NEGATIVE
Spec Grav, UA: 1.01 (ref 1.010–1.025)
Urobilinogen, UA: 0.2 U/dL
pH, UA: 6.5 (ref 5.0–8.0)

## 2022-11-29 MED ORDER — CYCLOBENZAPRINE HCL 10 MG PO TABS: 10.0000 mg | ORAL_TABLET | Freq: Three times a day (TID) | ORAL | 0 refills | Status: DC | PRN

## 2022-11-30 ENCOUNTER — Encounter (INDEPENDENT_AMBULATORY_CARE_PROVIDER_SITE_OTHER): Payer: Self-pay

## 2022-12-03 LAB — CYTOLOGY - PAP
Comment: NEGATIVE
Diagnosis: NEGATIVE
High risk HPV: NEGATIVE

## 2022-12-11 DIAGNOSIS — M7918 Myalgia, other site: Secondary | ICD-10-CM | POA: Insufficient documentation

## 2022-12-11 NOTE — Patient Instructions (Addendum)
It was a pleasure to see you today.  Labs are completely normal.  Pap smear taken.  Refilled Flexeril to take as needed for back pain.  Tetanus immunization update given.  Has had flu vaccine.  Clines COVID-19 vaccine.  Return in 1 year or as needed.

## 2023-01-05 ENCOUNTER — Encounter: Payer: Self-pay | Admitting: Internal Medicine

## 2023-01-06 ENCOUNTER — Ambulatory Visit
Admission: RE | Admit: 2023-01-06 | Discharge: 2023-01-06 | Disposition: A | Discharge status: Home / Self Care | Payer: Managed Care, Other (non HMO) | Source: Ambulatory Visit | Attending: Internal Medicine | Admitting: Internal Medicine

## 2023-01-06 ENCOUNTER — Ambulatory Visit (INDEPENDENT_AMBULATORY_CARE_PROVIDER_SITE_OTHER): Payer: Managed Care, Other (non HMO) | Admitting: Internal Medicine

## 2023-01-06 VITALS — BP 110/80 | HR 93 | Ht 61.0 in | Wt 107.0 lb

## 2023-01-06 DIAGNOSIS — M545 Low back pain, unspecified: Secondary | ICD-10-CM | POA: Diagnosis not present

## 2023-01-06 NOTE — Progress Notes (Signed)
Patient Care Team: [name], MD as PCP - General (Internal Medicine)  Visit Date: 01/06/23  Subjective:    Patient ID: Tammy Roberson , Female   DOB: June 28, 1990, 32 y.o.    MRN: [id number]   32 y.o. Female presents today for lower back pain. Patient sent a message yesterday complaining of dull back pain in the lower area near spine/pelvis/coccyx area. Denies injury, heavy lifting. Denies radiculopathy. She is requesting imaging.  Past Medical History:  Diagnosis Date   Gestational diabetes    Preterm labor    SVD (spontaneous vaginal delivery) 12/27/2012     Family History  Problem Relation Age of Onset   Hypothyroidism Mother     Social history: She has 2 children, a son and a daughter.  Does not consume alcohol.  Non-smoker.  Her mother is Malayzia Roberson who is also a patient here.  Patient works in [place] and lives in the [place] area.     Review of Systems  Constitutional:  Negative for fever and malaise/fatigue.  HENT:  Negative for congestion.   Eyes:  Negative for blurred vision.  Respiratory:  Negative for cough and shortness of breath.   Cardiovascular:  Negative for chest pain, palpitations and leg swelling.  Gastrointestinal:  Negative for vomiting.  Musculoskeletal:  Positive for back pain (Lower).  Skin:  Negative for rash.  Neurological:  Negative for loss of consciousness and headaches.        Objective:   Vitals: BP 110/80 (BP Location: Left Arm, Patient Position: Sitting, Cuff Size: Normal)   Pulse 93   Ht 5\' 1"  (1.549 m)   Wt 107 lb (48.5 kg)   LMP 12/24/2022   SpO2 97%   BMI 20.22 kg/m    Physical Exam Vitals and nursing note reviewed.  Constitutional:      General: She is not in acute distress.    Appearance: Normal appearance. She is not toxic-appearing.  HENT:     Head: Normocephalic and atraumatic.  Pulmonary:     Effort: Pulmonary effort is normal.  Skin:    General: Skin is warm and dry.  Neurological:      Mental Status: She is alert and oriented to person, place, and time. Mental status is at baseline.  Psychiatric:        Mood and Affect: Mood normal.        Behavior: Behavior normal.        Thought Content: Thought content normal.        Judgment: Judgment normal.       Results:   Studies obtained and personally reviewed by me:   Labs:       Component Value Date/Time   NA 141 11/22/2022 1009   K 4.6 11/22/2022 1009   CL 101 11/22/2022 1009   CO2 28 11/22/2022 1009   GLUCOSE 85 11/22/2022 1009   BUN 9 11/22/2022 1009   CREATININE 0.68 11/22/2022 1009   CALCIUM 9.7 11/22/2022 1009   PROT 7.1 11/22/2022 1009   ALBUMIN 3.1 (L) 08/03/2012 0540   AST 22 11/22/2022 1009   ALT 21 11/22/2022 1009   ALKPHOS 52 08/03/2012 0540   BILITOT 0.3 11/22/2022 1009   GFRNONAA >60 11/20/2022 1408   GFRNONAA 98 05/10/2019 1013   GFRAA 114 05/10/2019 1013     Lab Results  Component Value Date   WBC 4.0 11/22/2022   HGB 13.5 11/22/2022   HCT 41.3 11/22/2022   MCV 91.4 11/22/2022   PLT  293 11/22/2022    Lab Results  Component Value Date   CHOL 171 11/22/2022   HDL 68 11/22/2022   LDLCALC 83 11/22/2022   TRIG 104 11/22/2022   CHOLHDL 2.5 11/22/2022    No results found for: "HGBA1C"   Lab Results  Component Value Date   TSH 1.98 11/22/2022      Assessment & Plan:   Lower back pain: ordered XR lumbar spine. Can take as needed meloxicam, cyclobenzaprine.  Our front office person will work on finding a PT in the [place] area.  PT ordered for low back pain.  Addendum: X-ray is negative of the LS spine  I,Alexander Ruley,acting as a scribe for [name], MD.,have documented all relevant documentation on the behalf of [name], MD,as directed by  [name], MD while in the presence of [name], MD.   I, [name], MD, have reviewed all documentation for this visit. The documentation on 01/22/23 for the exam, diagnosis, procedures, and orders are all  accurate and complete.

## 2023-01-12 ENCOUNTER — Ambulatory Visit: Payer: 59 | Admitting: Cardiovascular Disease

## 2023-01-22 ENCOUNTER — Encounter: Payer: Self-pay | Admitting: Internal Medicine

## 2023-01-22 NOTE — Patient Instructions (Addendum)
Our front office person will try to assist you in obtaining a physical therapy office near [place] since you commute to [place].  X-ray of LS spine is negative.  May take meloxicam and cyclobenzaprine as needed.

## 2023-03-07 ENCOUNTER — Encounter: Payer: Self-pay | Admitting: Cardiovascular Disease

## 2023-03-07 ENCOUNTER — Ambulatory Visit: Payer: Managed Care, Other (non HMO) | Attending: Cardiovascular Disease | Admitting: Cardiovascular Disease

## 2023-03-07 VITALS — BP 124/80 | HR 69 | Ht 60.0 in | Wt 110.6 lb

## 2023-03-07 DIAGNOSIS — R0789 Other chest pain: Secondary | ICD-10-CM | POA: Insufficient documentation

## 2023-03-07 NOTE — Patient Instructions (Signed)
 Medication Instructions:  Your physician recommends that you continue on your current medications as directed. Please refer to the Current Medication list given to you today.  *If you need a refill on your cardiac medications before your next appointment, please call your pharmacy*   Testing/Procedures: Your physician has requested that you have an echocardiogram. Echocardiography is a painless test that uses sound waves to create images of your heart. It provides your doctor with information about the size and shape of your heart and how well your heart's chambers and valves are working. This procedure takes approximately one hour. There are no restrictions for this procedure. Please do NOT wear cologne, perfume, aftershave, or lotions (deodorant is allowed). Please arrive 15 minutes prior to your appointment time.  Please note: We ask at that you not bring children with you during ultrasound (echo/ vascular) testing. Due to room size and safety concerns, children are not allowed in the ultrasound rooms during exams. Our front office staff cannot provide observation of children in our lobby area while testing is being conducted. An adult accompanying a patient to their appointment will only be allowed in the ultrasound room at the discretion of the ultrasound technician under special circumstances. We apologize for any inconvenience.   Dr. Allyson Sabal has ordered a CT coronary calcium score.   Test locations:  [facility]  [facility]  This is $99 out of pocket.   Coronary CalciumScan A coronary calcium scan is an imaging test used to look for deposits of calcium and other fatty materials (plaques) in the inner lining of the blood vessels of the heart (coronary arteries). These deposits of calcium and plaques can partly clog and narrow the coronary arteries without producing any symptoms or warning signs.  This puts a person at risk for a heart attack. This test can detect these deposits before symptoms develop. Tell a health care provider about: Any allergies you have. All medicines you are taking, including vitamins, herbs, eye drops, creams, and over-the-counter medicines. Any problems you or family members have had with anesthetic medicines. Any blood disorders you have. Any surgeries you have had. Any medical conditions you have. Whether you are pregnant or may be pregnant. What are the risks? Generally, this is a safe procedure. However, problems may occur, including: Harm to a pregnant woman and her unborn baby. This test involves the use of radiation. Radiation exposure can be dangerous to a pregnant woman and her unborn baby. If you are pregnant, you generally should not have this procedure done. Slight increase in the risk of cancer. This is because of the radiation involved in the test. What happens before the procedure? No preparation is needed for this procedure. What happens during the procedure? You will undress and remove any jewelry around your neck or chest. You will put on a hospital gown. Sticky electrodes will be placed on your chest. The electrodes will be connected to an electrocardiogram (ECG) machine to record a tracing of the electrical activity of your heart. A CT scanner will take pictures of your heart. During this time, you will be asked to lie still and hold your breath for 2-3 seconds while a picture of your heart is being taken. The procedure may vary among health care providers and hospitals. What happens after the procedure? You can get dressed. You can return to your normal activities. It is up to you to get the results of your test.  Ask your health care provider, or the department that is doing the test, when your results will be ready. Summary A coronary calcium scan is an imaging test used to look for deposits of calcium and other fatty materials  (plaques) in the inner lining of the blood vessels of the heart (coronary arteries). Generally, this is a safe procedure. Tell your health care provider if you are pregnant or may be pregnant. No preparation is needed for this procedure. A CT scanner will take pictures of your heart. You can return to your normal activities after the scan is done. This information is not intended to replace advice given to you by your health care provider. Make sure you discuss any questions you have with your health care provider. Document Released: 07/10/2007 Document Revised: 12/01/2015 Document Reviewed: 12/01/2015 Elsevier Interactive Patient Education  2017 [employer].   Follow-Up: At [facility], you and your health needs are our priority.  As part of our continuing mission to provide you with exceptional heart care, we have created designated Provider Care Teams.  These Care Teams include your primary Cardiologist (physician) and Advanced Practice Providers (APPs -  Physician Assistants and Nurse Practitioners) who all work together to provide you with the care you need, when you need it.  We recommend signing up for the patient portal called "MyChart".  Sign up information is provided on this After Visit Summary.  MyChart is used to connect with patients for Virtual Visits (Telemedicine).  Patients are able to view lab/test results, encounter notes, upcoming appointments, etc.  Non-urgent messages can be sent to your provider as well.   To learn more about what you can do with MyChart, go to [email or web address].    Your next appointment:   We will see you on an as needed basis   Provider:   Nanetta Batty, MD

## 2023-03-07 NOTE — Assessment & Plan Note (Signed)
 Ms Sweeley dad 2 episodes of atypical chest pain the last which was at the beginning of December.  She had a week.  During which she had some dyspnea on exertion and some exertional chest pain.  After that she had chest discomfort that lasted for 2 weeks which ultimately resolved spontaneously.  She has no cardiac risk factors.  I suspect her pain is noncardiac.  I am going get a 2D echo and a coronary calcium score to further evaluate.

## 2023-03-07 NOTE — Progress Notes (Signed)
 03/07/2023 Talbert Face   30-May-1990  [id number]  Primary Physician Baxley, Jaynie Meyers, MD Primary Cardiologist: Avanell Leigh MD Bennye Bravo, [place]  HPI:  Tammy Roberson is a 33 y.o. 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  She was referred by her PCP, Dr. Zenon Hilda Baxley, for evaluation of atypical chest pain.  She has no cardiac risk factors.  There is no family history of heart disease.  She is never had a heart attack or stroke.  She is fairly active, walks dogs and lifts weights.  In early December she had a week long period of dyspnea and atypical chest pain.  The pain lasted for 2 weeks and ultimately resolved spontaneously.   Current Meds  Medication Sig   cyclobenzaprine  (FLEXERIL ) 10 MG tablet Take 1 tablet (10 mg total) by mouth 3 (three) times daily as needed for muscle spasms.   meloxicam  (MOBIC ) 15 MG tablet TAKE 1 TABLET BY MOUTH EVERY DAY WITH FOOD (Patient taking differently: Take 15 mg by mouth daily as needed for pain (to be taken with food).)     No Known Allergies  Social History   Socioeconomic History   Marital status: Single    Spouse name: Not on file   Number of children: Not on file   Years of education: Not on file   Highest education level: Not on file  Occupational History   Not on file  Tobacco Use   Smoking status: Never   Smokeless tobacco: Never  Substance and Sexual Activity   Alcohol use: No   Drug use: No   Sexual activity: Never  Other Topics Concern   Not on file  Social History Narrative   Not on file   Social Drivers of Health   Financial Resource Strain: Low Risk  (11/29/2022)   Overall Financial Resource Strain (CARDIA)    Difficulty of Paying Living Expenses: Not very hard  Food Insecurity: No Food Insecurity (11/29/2022)   Hunger Vital Sign    Worried About Running Out of Food in the Last Year: Never true    Ran Out of Food in the  Last Year: Never true  Transportation Needs: No Transportation Needs (11/29/2022)   PRAPARE - Administrator, Civil Service (Medical): No    Lack of Transportation (Non-Medical): No  Physical Activity: Not on file  Stress: Not on file  Social Connections: Not on file  Intimate Partner Violence: Not At Risk (11/29/2022)   Humiliation, Afraid, Rape, and Kick questionnaire    Fear of Current or Ex-Partner: No    Emotionally Abused: No    Physically Abused: No    Sexually Abused: No     Review of Systems: General: negative for chills, fever, night sweats or weight changes.  Cardiovascular: negative for chest pain, dyspnea on exertion, edema, orthopnea, palpitations, paroxysmal nocturnal dyspnea or shortness of breath Dermatological: negative for rash Respiratory: negative for cough or wheezing Urologic: negative for hematuria Abdominal: negative for nausea, vomiting, diarrhea, bright red blood per rectum, melena, or hematemesis Neurologic: negative for visual changes, syncope, or dizziness All other systems reviewed and are otherwise negative except as noted above.    Blood pressure 124/80, pulse 69, height 5' (1.524 m), weight 110 lb 9.6 oz (50.2 kg), SpO2 99%, unknown if currently breastfeeding.  General appearance: alert and no distress Neck: no adenopathy, no carotid bruit, no JVD, supple, symmetrical, trachea  midline, and thyroid not enlarged, symmetric, no tenderness/mass/nodules Lungs: clear to auscultation bilaterally Heart: regular rate and rhythm, S1, S2 normal, no murmur, click, rub or gallop Extremities: extremities normal, atraumatic, no cyanosis or edema Pulses: 2+ and symmetric Skin: Skin color, texture, turgor normal. No rashes or lesions Neurologic: Grossly normal  EKG EKG Interpretation Date/Time:  Monday March 07 2023 16:01:27 EST Ventricular Rate:  69 PR Interval:  152 QRS Duration:  90 QT Interval:  394 QTC Calculation: 422 R  Axis:   75  Text Interpretation: Normal sinus rhythm with sinus arrhythmia Possible Left atrial enlargement When compared with ECG of 20-Nov-2022 13:16, PREVIOUS ECG IS PRESENT Confirmed by Lauro Portal [phone]) on 03/07/2023 4:02:56 PM    ASSESSMENT AND PLAN:   Atypical chest pain Ms Hemple dad 2 episodes of atypical chest pain the last which was at the beginning of December.  She had a week.  During which she had some dyspnea on exertion and some exertional chest pain.  After that she had chest discomfort that lasted for 2 weeks which ultimately resolved spontaneously.  She has no cardiac risk factors.  I suspect her pain is noncardiac.  I am going get a 2D echo and a coronary calcium score to further evaluate.     Avanell Leigh MD FACP,FACC,FAHA, [facility] 03/07/2023 4:12 PM

## 2023-04-18 ENCOUNTER — Other Ambulatory Visit (HOSPITAL_BASED_OUTPATIENT_CLINIC_OR_DEPARTMENT_OTHER): Payer: Managed Care, Other (non HMO)

## 2023-05-13 ENCOUNTER — Other Ambulatory Visit (HOSPITAL_BASED_OUTPATIENT_CLINIC_OR_DEPARTMENT_OTHER)

## 2023-05-20 ENCOUNTER — Other Ambulatory Visit (HOSPITAL_BASED_OUTPATIENT_CLINIC_OR_DEPARTMENT_OTHER)

## 2023-05-20 ENCOUNTER — Ambulatory Visit (HOSPITAL_BASED_OUTPATIENT_CLINIC_OR_DEPARTMENT_OTHER)
Admission: RE | Admit: 2023-05-20 | Discharge: 2023-05-20 | Disposition: A | Discharge status: Home / Self Care | Payer: Self-pay | Source: Ambulatory Visit | Attending: Cardiovascular Disease | Admitting: Cardiovascular Disease

## 2023-05-20 DIAGNOSIS — R0789 Other chest pain: Secondary | ICD-10-CM | POA: Insufficient documentation

## 2023-05-26 ENCOUNTER — Telehealth: Admitting: Physician Assistant

## 2023-05-26 DIAGNOSIS — N941 Unspecified dyspareunia: Secondary | ICD-10-CM

## 2023-05-26 DIAGNOSIS — M545 Low back pain, unspecified: Secondary | ICD-10-CM

## 2023-05-26 DIAGNOSIS — N898 Other specified noninflammatory disorders of vagina: Secondary | ICD-10-CM

## 2023-05-26 DIAGNOSIS — R109 Unspecified abdominal pain: Secondary | ICD-10-CM

## 2023-05-30 ENCOUNTER — Ambulatory Visit: Admitting: Internal Medicine

## 2023-05-30 VITALS — BP 110/70 | HR 72 | Ht 60.0 in | Wt 114.0 lb

## 2023-05-30 DIAGNOSIS — B9689 Other specified bacterial agents as the cause of diseases classified elsewhere: Secondary | ICD-10-CM

## 2023-05-30 DIAGNOSIS — N760 Acute vaginitis: Secondary | ICD-10-CM | POA: Diagnosis not present

## 2023-05-30 DIAGNOSIS — N898 Other specified noninflammatory disorders of vagina: Secondary | ICD-10-CM

## 2023-05-30 DIAGNOSIS — R82998 Other abnormal findings in urine: Secondary | ICD-10-CM

## 2023-05-30 DIAGNOSIS — R300 Dysuria: Secondary | ICD-10-CM

## 2023-05-30 LAB — POCT WET PREP (WET MOUNT)

## 2023-05-30 LAB — POCT URINALYSIS DIP (CLINITEK)
Bilirubin, UA: NEGATIVE
Glucose, UA: NEGATIVE mg/dL
Ketones, POC UA: NEGATIVE mg/dL
Nitrite, UA: POSITIVE — AB
POC PROTEIN,UA: NEGATIVE
Spec Grav, UA: 1.015 (ref 1.010–1.025)
Urobilinogen, UA: 0.2 U/dL
pH, UA: 6.5 (ref 5.0–8.0)

## 2023-05-30 MED ORDER — METRONIDAZOLE 500 MG PO TABS
500.0000 mg | ORAL_TABLET | Freq: Two times a day (BID) | ORAL | 0 refills | Status: AC
Start: 2023-05-30 — End: 2023-06-06

## 2023-05-30 NOTE — Progress Notes (Signed)
  Because you are having back pain, belly pain and painful intercourse, I feel your condition warrants further evaluation and I recommend that you be seen in a face-to-face visit to have official testing to confirm a diagnosis.   NOTE: There will be NO CHARGE for this E-Visit   If you are having a true medical emergency, please call 911.     For an urgent face to face visit, [facility] has multiple urgent care centers for your convenience.  Click the link below for the full list of locations and hours, walk-in wait times, appointment scheduling options and driving directions:  Urgent Care - [place], [place], [place], [place], [place], [place]  [place]     Your MyChart E-visit questionnaire answers were reviewed by a board certified advanced clinical practitioner to complete your personal care plan based on your specific symptoms.    Thank you for using e-Visits.     I have spent 5 minutes in review of e-visit questionnaire, review and updating patient chart, medical decision making and response to patient.   Angelia Kelp, PA-C

## 2023-05-30 NOTE — Progress Notes (Addendum)
 Patient Care Team: Sylvan Evener, MD as PCP - General (Internal Medicine)  Visit Date: 05/30/23  Subjective:   Chief Complaint  Patient presents with   Vaginal Itching    Started last week.   Patient NU:[id number] Colello,Female DOB:1990/10/21,33 y.o. YQI:[id number]   33 y.o. Female presents today for acute visit with Vaginal Pruritus; Vaginal Mal-odor. Says that she noticed mal-odor last week, and as since noticed some vaginal itching as well. Denies recent antibiotic use. Notes her menstrual cycle was about 2-3 weeks ago.   Past Medical History:  Diagnosis Date   Gestational diabetes    Preterm labor    SVD (spontaneous vaginal delivery) 12/27/2012  No Known Allergies  Family History  Problem Relation Age of Onset   Hypothyroidism Mother    Social Hx:  Review of Systems  Genitourinary:        (+) Vaginal Pruritus (+) Vaginal Mal-odor  All other systems reviewed and are negative.    Objective:  Vitals: BP 110/70   Pulse 72   Ht 5' (1.524 m)   Wt 114 lb (51.7 kg)   LMP 05/06/2023   SpO2 99%   BMI 22.26 kg/m   Physical Exam Vitals and nursing note reviewed.  Constitutional:      General: She is not in acute distress.    Appearance: Normal appearance. She is not toxic-appearing.  HENT:     Head: Normocephalic and atraumatic.  Pulmonary:     Effort: Pulmonary effort is normal.  Genitourinary:    Comments: Heavy bacteria and clue cells on wet prep Skin:    General: Skin is warm and dry.  Neurological:     Mental Status: She is alert and oriented to person, place, and time. Mental status is at baseline.  Psychiatric:        Mood and Affect: Mood normal.        Behavior: Behavior normal.        Thought Content: Thought content normal.        Judgment: Judgment normal.     Results:  Studies Obtained And Personally Reviewed By Me: Labs:     Component Value Date/Time   NA 141 11/22/2022 1009   K 4.6 11/22/2022 1009   CL 101 11/22/2022 1009   CO2 28  11/22/2022 1009   GLUCOSE 85 11/22/2022 1009   BUN 9 11/22/2022 1009   CREATININE 0.68 11/22/2022 1009   CALCIUM 9.7 11/22/2022 1009   PROT 7.1 11/22/2022 1009   ALBUMIN 3.1 (L) 08/03/2012 0540   AST 22 11/22/2022 1009   ALT 21 11/22/2022 1009   ALKPHOS 52 08/03/2012 0540   BILITOT 0.3 11/22/2022 1009   GFRNONAA >60 11/20/2022 1408   GFRNONAA 98 05/10/2019 1013   GFRAA 114 05/10/2019 1013    Lab Results  Component Value Date   WBC 4.0 11/22/2022   HGB 13.5 11/22/2022   HCT 41.3 11/22/2022   MCV 91.4 11/22/2022   PLT 293 11/22/2022   Lab Results  Component Value Date   CHOL 171 11/22/2022   HDL 68 11/22/2022   LDLCALC 83 11/22/2022   TRIG 104 11/22/2022   CHOLHDL 2.5 11/22/2022   Lab Results  Component Value Date   TSH 1.98 11/22/2022    Results for orders placed or performed in visit on 05/30/23  POCT URINALYSIS DIP (CLINITEK)  Result Value Ref Range   Color, UA yellow yellow   Clarity, UA clear clear   Glucose, UA negative negative mg/dL   Bilirubin, UA  negative negative   Ketones, POC UA negative negative mg/dL   Spec Grav, UA 0.347 4.259 - 1.025   Blood, UA trace-intact (A) negative   pH, UA 6.5 5.0 - 8.0   POC PROTEIN,UA negative negative, trace   Urobilinogen, UA 0.2 0.2 or 1.0 E.U./dL   Nitrite, UA Positive (A) Negative   Leukocytes, UA Small (1+) (A) Negative  POCT Wet Prep ([place]et [place])  Result Value Ref Range   Source Wet Prep POC VAGINAL    WBC, Wet Prep HPF POC     Bacteria Wet Prep HPF POC Many (A) Few   Bacteria Wet Prep Morphology POC     Clue Cells Wet Prep HPF POC Many (A) None   Clue Cells Wet Prep Whiff POC     Yeast Wet Prep HPF POC     KOH Wet Prep POC     Trichomonas Wet Prep HPF POC     Assessment & Plan:   Bacterial Vaginosis: has had some vaginal itching and mal-odor for the past week. Bacteria and clue cells appreciated on wet prep. Sending in Flagyl  500 mg to take by mouth twice daily for 7 days. To be followed by vinegar and  water douche one time only.  Leukocytes in Urine: w/ nitrites and trace blood on dipstick. Sending for culture. No UTI symptoms Culture grew E.coli. Sent in Cipro  250 mg twice daily for 7 days     I,Emily Lagle,acting as a scribe for Sylvan Evener, MD.,have documented all relevant documentation on the behalf of Sylvan Evener, MD,as directed by  Sylvan Evener, MD while in the presence of Sylvan Evener, MD.   I, Sylvan Evener, MD, have reviewed all documentation for this visit. The documentation on 05/30/23 for the exam, diagnosis, procedures, and orders are all accurate and complete.

## 2023-06-01 ENCOUNTER — Encounter: Payer: Self-pay | Admitting: Internal Medicine

## 2023-06-01 ENCOUNTER — Telehealth: Payer: Self-pay | Admitting: Internal Medicine

## 2023-06-01 DIAGNOSIS — B9620 Unspecified Escherichia coli [E. coli] as the cause of diseases classified elsewhere: Secondary | ICD-10-CM

## 2023-06-01 LAB — URINE CULTURE
MICRO NUMBER:: 16412987
SPECIMEN QUALITY:: ADEQUATE

## 2023-06-01 MED ORDER — FLUCONAZOLE 150 MG PO TABS: 150.0000 mg | ORAL_TABLET | Freq: Once | ORAL | 0 refills | Status: AC

## 2023-06-01 MED ORDER — CIPROFLOXACIN HCL 250 MG PO TABS: 250.0000 mg | ORAL_TABLET | Freq: Two times a day (BID) | ORAL | 0 refills | Status: DC

## 2023-06-01 NOTE — Telephone Encounter (Signed)
  Sending in Cipro for 7 days and one dose Diflucan

## 2023-06-06 ENCOUNTER — Ambulatory Visit (HOSPITAL_COMMUNITY): Attending: Cardiology

## 2023-06-06 DIAGNOSIS — R0789 Other chest pain: Secondary | ICD-10-CM | POA: Diagnosis present

## 2023-06-06 LAB — ECHOCARDIOGRAM COMPLETE
Area-P 1/2: 4.49 cm2
S' Lateral: 3 cm

## 2023-06-08 NOTE — Patient Instructions (Addendum)
 Take Flagyl  500 mg twice daily for 7 days for Bacterial vaginosis. Urine has been cultured due to leukocytes in urine. We will contact you with results of urine culture.  Addendum: Also has E.coli UTI and Rx has been sent in for Cipro . Pt. Notified of UTI and plans to send in Cipro .

## 2023-06-09 ENCOUNTER — Ambulatory Visit: Payer: Self-pay | Admitting: *Deleted

## 2023-10-17 LAB — PANORAMA PRENATAL TEST FULL PANEL:PANORAMA TEST PLUS 5 ADDITIONAL MICRODELETIONS: FETAL FRACTION: 16.9

## 2023-11-28 ENCOUNTER — Other Ambulatory Visit: Payer: 59

## 2023-11-28 DIAGNOSIS — Z0000 Encounter for general adult medical examination without abnormal findings: Secondary | ICD-10-CM

## 2023-11-28 DIAGNOSIS — Z13220 Encounter for screening for lipoid disorders: Secondary | ICD-10-CM

## 2023-11-28 DIAGNOSIS — Z1329 Encounter for screening for other suspected endocrine disorder: Secondary | ICD-10-CM

## 2023-11-29 LAB — CBC WITH DIFFERENTIAL/PLATELET
Absolute Lymphocytes: 1613 {cells}/uL (ref 850–3900)
Absolute Monocytes: 400 {cells}/uL (ref 200–950)
Basophils Absolute: 7 {cells}/uL (ref 0–200)
Basophils Relative: 0.1 %
Eosinophils Absolute: 30 {cells}/uL (ref 15–500)
Eosinophils Relative: 0.4 %
HCT: 38.3 % (ref 35.0–45.0)
Hemoglobin: 12.6 g/dL (ref 11.7–15.5)
MCH: 30.8 pg (ref 27.0–33.0)
MCHC: 32.9 g/dL (ref 32.0–36.0)
MCV: 93.6 fL (ref 80.0–100.0)
MPV: 8.8 fL (ref 7.5–12.5)
Monocytes Relative: 5.4 %
Neutro Abs: 5350 {cells}/uL (ref 1500–7800)
Neutrophils Relative %: 72.3 %
Platelets: 357 Thousand/uL (ref 140–400)
RBC: 4.09 Million/uL (ref 3.80–5.10)
RDW: 12.6 % (ref 11.0–15.0)
Total Lymphocyte: 21.8 %
WBC: 7.4 Thousand/uL (ref 3.8–10.8)

## 2023-11-29 LAB — COMPREHENSIVE METABOLIC PANEL WITH GFR
AG Ratio: 1.4 (calc) (ref 1.0–2.5)
ALT: 22 U/L (ref 6–29)
AST: 19 U/L (ref 10–30)
Albumin: 3.9 g/dL (ref 3.6–5.1)
Alkaline phosphatase (APISO): 59 U/L (ref 31–125)
BUN: 9 mg/dL (ref 7–25)
CO2: 27 mmol/L (ref 20–32)
Calcium: 9.2 mg/dL (ref 8.6–10.2)
Chloride: 104 mmol/L (ref 98–110)
Creat: 0.57 mg/dL (ref 0.50–0.97)
Globulin: 2.8 g/dL (ref 1.9–3.7)
Glucose, Bld: 73 mg/dL (ref 65–99)
Potassium: 5 mmol/L (ref 3.5–5.3)
Sodium: 139 mmol/L (ref 135–146)
Total Bilirubin: 0.3 mg/dL (ref 0.2–1.2)
Total Protein: 6.7 g/dL (ref 6.1–8.1)
eGFR: 123 mL/min/1.73m2 (ref >=60)

## 2023-11-29 LAB — LIPID PANEL
Cholesterol: 270 mg/dL — ABNORMAL HIGH (ref <200)
HDL: 90 mg/dL (ref >=50)
LDL Cholesterol (Calc): 144 mg/dL — ABNORMAL HIGH
Non-HDL Cholesterol (Calc): 180 mg/dL — ABNORMAL HIGH (ref <130)
Total CHOL/HDL Ratio: 3 (calc) (ref <5.0)
Triglycerides: 214 mg/dL — ABNORMAL HIGH (ref <150)

## 2023-11-29 LAB — TSH: TSH: 1.12 m[IU]/L

## 2023-12-02 NOTE — Progress Notes (Signed)
 Annual Comprehensive Physical Exam   Patient Care Team: Perri Ronal PARAS, MD as PCP - General (Internal Medicine)  Visit Date: 12/05/23   Chief Complaint  Patient presents with   Annual Exam   Subjective:  Patient: Tammy Roberson, Female DOB: 11-28-1990, 33 y.o. MRN: [id number] Vitals:   12/05/23 0956  BP: 90/70   Tammy Roberson is a 33 y.o. Female who presents today for her  Annual Comprehensive Physical Exam. Patient has Musculoskeletal pain and Atypical chest pain on their problem list.  Her general health is excellent.  She works as a Teacher, Early Years/pre at [employer] and has a Surveyor, Quantity.D. degree from [employer].   She is approximately [redacted] weeks pregnant. She said that she is doing fine other than constipation and heartburn. She has been treating the constipation with fiber, stool softeners and Miralax.     Seen in ED on 11/20/22 for chest pain. Given lidocaine  patch, ketorolac , morphine . Chest X-ray showed no acute cardiopulmonary disease. Denies any recurrent chest pain. Has upcoming follow-up with cardiologist.   Labs 11/28/2023 CHOL 270, Triglycerides 214, LDL 144, otherwise WNL.   11/29/2022 Pap smear Negative for intraepithelial lesion or malignancy   Vaccine counseling: Covid-19 vaccine deferred due to pregnancy. Discontinued Hepatitis B and HPV vaccines.  Health Maintenance  Topic Date Due   COVID-19 Vaccine (4 - 2025-26 season) 12/21/2023 (Originally 09/26/2023)   HPV VACCINES (1 - 3-dose SCDM series) 12/04/2024 (Originally 03/06/2017)   Cervical Cancer Screening (HPV/Pap Cotest)  11/29/2027   DTaP/Tdap/Td (3 - Td or Tdap) 11/28/2032   Influenza Vaccine  Completed   HIV Screening  Completed   Pneumococcal Vaccine  Aged Out   Meningococcal B Vaccine  Aged Out   Hepatitis B Vaccines 19-59 Average Risk  Discontinued   Hepatitis C Screening  Discontinued     Review of Systems  Constitutional:  Negative for fever and malaise/fatigue.  HENT:  Negative for  congestion.   Eyes:  Negative for blurred vision.  Respiratory:  Negative for cough and shortness of breath.   Cardiovascular:  Negative for chest pain, palpitations and leg swelling.  Gastrointestinal:  Negative for vomiting.  Musculoskeletal:  Negative for back pain.  Skin:  Negative for rash.  Neurological:  Negative for loss of consciousness and headaches.   Objective:  Vitals: body mass index is 23.63 kg/m. Today's Vitals   12/05/23 0956  BP: 90/70  Pulse: 77  SpO2: 98%  Weight: 121 lb (54.9 kg)  Height: 5' (1.524 m)   Physical Exam Vitals and nursing note reviewed.  Constitutional:      General: She is not in acute distress.    Appearance: Normal appearance. She is not ill-appearing or toxic-appearing.  HENT:     Head: Normocephalic and atraumatic.     Right Ear: Hearing, tympanic membrane, ear canal and external ear normal.     Left Ear: Hearing, tympanic membrane, ear canal and external ear normal.     Mouth/Throat:     Pharynx: Oropharynx is clear.  Eyes:     Extraocular Movements: Extraocular movements intact.     Pupils: Pupils are equal, round, and reactive to light.  Neck:     Thyroid: No thyroid mass, thyromegaly or thyroid tenderness.     Vascular: No carotid bruit.  Cardiovascular:     Rate and Rhythm: Normal rate and regular rhythm. No extrasystoles are present.    Pulses:          Dorsalis pedis pulses are 2+ on  the right side and 2+ on the left side.     Heart sounds: Normal heart sounds. No murmur heard.    No friction rub. No gallop.  Pulmonary:     Effort: Pulmonary effort is normal.     Breath sounds: Normal breath sounds. No decreased breath sounds, wheezing, rhonchi or rales.  Chest:     Chest wall: No mass.     Comments: Breast exam deferred to GYN.  Abdominal:     Palpations: Abdomen is soft. There is no hepatomegaly, splenomegaly or mass.     Tenderness: There is no abdominal tenderness.     Hernia: No hernia is present.   Genitourinary:    Comments: Exam deferred to GYN.  Musculoskeletal:     Cervical back: Normal range of motion.     Right lower leg: No edema.     Left lower leg: No edema.  Lymphadenopathy:     Cervical: No cervical adenopathy.     Right cervical: No superficial cervical adenopathy.    Left cervical: No superficial cervical adenopathy.     Upper Body:     Right upper body: No supraclavicular adenopathy.     Left upper body: No supraclavicular adenopathy.  Skin:    General: Skin is warm and dry.  Neurological:     General: No focal deficit present.     Mental Status: She is alert and oriented to person, place, and time. Mental status is at baseline.     Sensory: Sensation is intact.     Motor: Motor function is intact. No weakness.     Deep Tendon Reflexes: Reflexes are normal and symmetric.  Psychiatric:        Attention and Perception: Attention normal.        Mood and Affect: Mood normal.        Speech: Speech normal.        Behavior: Behavior normal.        Thought Content: Thought content normal.        Cognition and Memory: Cognition normal.        Judgment: Judgment normal.     No current outpatient medications  Past Medical History:  Diagnosis Date   Gestational diabetes    Preterm labor    SVD (spontaneous vaginal delivery) 12/27/2012   Medical/Surgical History Narrative:  Allergic/Intolerant to: No Known Allergies  Past Surgical History:  Procedure Laterality Date   NO PAST SURGERIES     Family History  Problem Relation Age of Onset   Hypothyroidism Mother    Heart attack Mother    Family History Narrative: Mother with history of hypothyroidism and double valve replacement due to strep viridans endocarditis. Father healthy.   Social history: She is single. She has 2 children, a son and a daughter. She does not consume alcohol. Non-smoker. Her mother is Tammy Roberson who is also a patient here.    Most Recent Health Risks Assessment:   Most Recent Social  Determinants of Health (Including Hx of Tobacco, Alcohol, and Drug Use) SDOH Screenings   Food Insecurity: Patient Declined (05/30/2023)  Housing: Low Risk  (05/30/2023)  Transportation Needs: No Transportation Needs (05/30/2023)  Utilities: Not At Risk (11/29/2022)  Alcohol Screen: Low Risk  (05/30/2023)  Depression (PHQ2-9): Low Risk  (05/30/2023)  Financial Resource Strain: Patient Declined (05/30/2023)  Physical Activity: Insufficiently Active (05/30/2023)  Social Connections: Unknown (05/30/2023)  Stress: Stress Concern Present (05/30/2023)  Tobacco Use: Low Risk  (12/05/2023)  Health Literacy: Adequate Health Literacy (  11/29/2022)   Social History   Tobacco Use   Smoking status: Never   Smokeless tobacco: Never  Substance Use Topics   Alcohol use: No   Drug use: No    Most Recent Fall Risk Assessment:    05/10/2022   11:35 AM  Fall Risk   Falls in the past year? 0  Injury with Fall? 0  Risk for fall due to : No Fall Risks  Follow up Falls prevention discussed   Most Recent Anxiety/Depression Screenings:    05/30/2023   10:38 AM 05/10/2022   11:35 AM  PHQ 2/9 Scores  PHQ - 2 Score 0 0    Results:  Studies Obtained And Personally Reviewed By Me:   11/29/2022 Pap smear Negative for intraepithelial lesion or malignancy   Labs:  CBC w/ Differential Lab Results  Component Value Date   WBC 7.4 11/28/2023   RBC 4.09 11/28/2023   HGB 12.6 11/28/2023   HCT 38.3 11/28/2023   PLT 357 11/28/2023   MCV 93.6 11/28/2023   MCH 30.8 11/28/2023   MCHC 32.9 11/28/2023   RDW 12.6 11/28/2023   MPV 8.8 11/28/2023   LYMPHSABS 1,411 06/29/2021   BASOSABS 7 11/28/2023    Comprehensive Metabolic Panel Lab Results  Component Value Date   NA 139 11/28/2023   K 5.0 11/28/2023   CL 104 11/28/2023   CO2 27 11/28/2023   GLUCOSE 73 11/28/2023   BUN 9 11/28/2023   CREATININE 0.57 11/28/2023   CALCIUM 9.2 11/28/2023   PROT 6.7 11/28/2023   ALBUMIN 3.1 (L) 08/03/2012   AST 19 11/28/2023    ALT 22 11/28/2023   ALKPHOS 52 08/03/2012   BILITOT 0.3 11/28/2023   EGFR 123 11/28/2023   GFRNONAA >60 11/20/2022   Lipid Panel  Lab Results  Component Value Date   CHOL 270 (H) 11/28/2023   HDL 90 11/28/2023   LDLCALC 144 (H) 11/28/2023   TRIG 214 (H) 11/28/2023   TSH Lab Results  Component Value Date   TSH 1.12 11/28/2023   Assessment & Plan:   Orders Placed This Encounter  Procedures   POCT URINALYSIS DIP (CLINITEK)   She is approximately [redacted] weeks pregnant. She said that she is doing fine other than constipation and heartburn. She has been treating the constipation with fiber, stool softeners and Miralax.     Advised to ask OBGYN if she can take an acid blocker for heartburn.    Seen in ED on 11/20/22 for chest pain. Given lidocaine  patch, ketorolac , morphine . Chest X-ray showed no acute cardiopulmonary disease. Denies any recurrent chest pain. Has upcoming follow-up with cardiologist.   11/29/2022 Pap smear Negative for intraepithelial lesion or malignancy   Vaccine counseling: Covid-19 vaccine deferred due to pregnancy. Discontinued Hepatitis B and HPV vaccines.    Return in one year or as needed.    Annual Comprehensive Physical Exam done today including the all of the following: Reviewed patient's Family Medical History Reviewed patient's SDOH and reviewed tobacco, alcohol, and drug use.  Reviewed and updated list of patient's medical providers Assessment of cognitive impairment was done Assessed patient's functional ability Established a written schedule for health screening services Health Risk Assessent Completed and Reviewed  Discussed health benefits of physical activity, and encouraged her to engage in regular exercise appropriate for her age and condition.   I,Makayla C Reid,acting as a scribe for Ronal JINNY Hailstone, MD.,have documented all relevant documentation on the behalf of Ronal JINNY Hailstone, MD,as directed by  Ronal  JINNY Hailstone, MD while in the presence of  Ronal JINNY Hailstone, MD.  I, Ronal JINNY Hailstone, MD, have reviewed all documentation for and agree with the above Annual Wellness Visit documentation.  Ronal JINNY Hailstone, MD Internal Medicine 12/05/2023

## 2023-12-05 ENCOUNTER — Encounter: Payer: Self-pay | Admitting: Internal Medicine

## 2023-12-05 ENCOUNTER — Ambulatory Visit: Payer: 59 | Admitting: Internal Medicine

## 2023-12-05 VITALS — BP 90/70 | HR 77 | Ht 60.0 in | Wt 121.0 lb

## 2023-12-05 DIAGNOSIS — Z3490 Encounter for supervision of normal pregnancy, unspecified, unspecified trimester: Secondary | ICD-10-CM

## 2023-12-05 DIAGNOSIS — Z0000 Encounter for general adult medical examination without abnormal findings: Secondary | ICD-10-CM | POA: Diagnosis not present

## 2023-12-05 LAB — POCT URINALYSIS DIP (CLINITEK)
Bilirubin, UA: NEGATIVE
Blood, UA: NEGATIVE
Glucose, UA: NEGATIVE mg/dL
Ketones, POC UA: NEGATIVE mg/dL
Leukocytes, UA: NEGATIVE
Nitrite, UA: NEGATIVE
POC PROTEIN,UA: NEGATIVE
Spec Grav, UA: 1.01 (ref 1.010–1.025)
Urobilinogen, UA: 0.2 U/dL
pH, UA: 7.5 (ref 5.0–8.0)

## 2023-12-05 NOTE — Patient Instructions (Addendum)
 It was a pleasure to see you today. I have reviewed lipid panel and although total  LDL (bad) cholesterol and triglycerides are elevated, it may be related to pregnancy and diet choices. Would suggest we repeat this panel in one year and decide if it needs to be treated at that time. Congratulations and Best wishes always!

## 2024-12-03 ENCOUNTER — Other Ambulatory Visit

## 2024-12-10 ENCOUNTER — Encounter: Admitting: Internal Medicine
